# Patient Record
Sex: Female | Born: 1965 | Race: White | Hispanic: No | Marital: Married | Smoking: Never smoker
Health system: Southern US, Community
[De-identification: ages and names within clinical notes are randomized; demographics above are authoritative.]

## PROBLEM LIST (undated history)

## (undated) DIAGNOSIS — B019 Varicella without complication: Secondary | ICD-10-CM

## (undated) DIAGNOSIS — IMO0001 Reserved for inherently not codable concepts without codable children: Secondary | ICD-10-CM

## (undated) DIAGNOSIS — N390 Urinary tract infection, site not specified: Secondary | ICD-10-CM

## (undated) HISTORY — PX: WISDOM TOOTH EXTRACTION: SHX21

## (undated) HISTORY — DX: Reserved for inherently not codable concepts without codable children: IMO0001

## (undated) HISTORY — DX: Urinary tract infection, site not specified: N39.0

## (undated) HISTORY — DX: Varicella without complication: B01.9

---

## 2010-06-29 ENCOUNTER — Ambulatory Visit: Payer: Self-pay | Admitting: Unknown Physician Specialty

## 2011-07-13 ENCOUNTER — Ambulatory Visit: Payer: Self-pay | Admitting: Unknown Physician Specialty

## 2011-07-19 LAB — HM PAP SMEAR

## 2012-07-08 ENCOUNTER — Ambulatory Visit: Payer: Self-pay | Admitting: Internal Medicine

## 2012-07-16 ENCOUNTER — Ambulatory Visit: Payer: Self-pay | Admitting: Internal Medicine

## 2012-08-08 ENCOUNTER — Encounter: Payer: Self-pay | Admitting: Internal Medicine

## 2012-08-08 ENCOUNTER — Ambulatory Visit (INDEPENDENT_AMBULATORY_CARE_PROVIDER_SITE_OTHER): Payer: BC Managed Care – PPO | Admitting: Internal Medicine

## 2012-08-08 VITALS — BP 140/90 | HR 67 | Temp 98.7°F | Ht 68.25 in | Wt 142.0 lb

## 2012-08-08 DIAGNOSIS — Z0000 Encounter for general adult medical examination without abnormal findings: Secondary | ICD-10-CM

## 2012-08-08 DIAGNOSIS — Z1239 Encounter for other screening for malignant neoplasm of breast: Secondary | ICD-10-CM

## 2012-08-08 NOTE — Assessment & Plan Note (Signed)
General exam today is normal. Mammogram scheduled. Health maintenance is up to date with the exception of tetanus and pertussis Vaccine. Patient was given information on this and like to read over it before receiving vaccine. Encourage continued efforts at healthy diet and regular physical activity.

## 2012-08-08 NOTE — Progress Notes (Signed)
  Subjective:    Patient ID: Latasha Cooper, female    DOB: 18-Mar-1966, 46 y.o.   MRN: [id number]  HPI 46 year old female presents to establish care. She reports she is generally feeling well. She has no concerns today. She follows a healthy diet and exercises on a regular basis. She has a history of constipation for which she uses a psyllium supplement. She reports that bowel movements have been more regular with this medication. She denies any abdominal pain, painful bowel movements, blood in her stool. In regards to health maintenance, she is due for mammogram.  Outpatient Encounter Prescriptions as of 08/08/2012  Medication Sig Dispense Refill  . IUD's (PARAGARD INTRAUTERINE COPPER IU) 1 Device by Intrauterine route once.      . psyllium (REGULOID) 0.52 G capsule Take 0.52 g by mouth daily.        Review of Systems  Constitutional: Negative for fever, chills, appetite change, fatigue and unexpected weight change.  HENT: Negative for ear pain, congestion, sore throat, trouble swallowing, neck pain, voice change and sinus pressure.   Eyes: Negative for visual disturbance.  Respiratory: Negative for cough, shortness of breath, wheezing and stridor.   Cardiovascular: Negative for chest pain, palpitations and leg swelling.  Gastrointestinal: Negative for nausea, vomiting, abdominal pain, diarrhea, constipation, blood in stool, abdominal distention and anal bleeding.  Genitourinary: Negative for dysuria and flank pain.  Musculoskeletal: Negative for myalgias, arthralgias and gait problem.  Skin: Negative for color change and rash.  Neurological: Negative for dizziness and headaches.  Hematological: Negative for adenopathy. Does not bruise/bleed easily.  Psychiatric/Behavioral: Negative for suicidal ideas, disturbed wake/sleep cycle and dysphoric mood. The patient is not nervous/anxious.    BP 140/90  Pulse 67  Temp 98.7 F (37.1 C) (Oral)  Ht 5' 8.25" (1.734 m)  Wt 142 lb (64.411 kg)  BMI  21.43 kg/m2  SpO2 98%  LMP 07/29/2012     Objective:   Physical Exam  Constitutional: She is oriented to person, place, and time. She appears well-developed and well-nourished. No distress.  HENT:  Head: Normocephalic and atraumatic.  Right Ear: External ear normal.  Left Ear: External ear normal.  Nose: Nose normal.  Mouth/Throat: Oropharynx is clear and moist. No oropharyngeal exudate.  Eyes: Conjunctivae are normal. Pupils are equal, round, and reactive to light. Right eye exhibits no discharge. Left eye exhibits no discharge. No scleral icterus.  Neck: Normal range of motion. Neck supple. No tracheal deviation present. No thyromegaly present.  Cardiovascular: Normal rate, regular rhythm, normal heart sounds and intact distal pulses.  Exam reveals no gallop and no friction rub.   No murmur heard. Pulmonary/Chest: Effort normal and breath sounds normal. No respiratory distress. She has no wheezes. She has no rales. She exhibits no tenderness.  Abdominal: Soft. Bowel sounds are normal. She exhibits no distension and no mass. There is no tenderness. There is no guarding.  Musculoskeletal: Normal range of motion. She exhibits no edema and no tenderness.  Lymphadenopathy:    She has no cervical adenopathy.  Neurological: She is alert and oriented to person, place, and time. No cranial nerve deficit. She exhibits normal muscle tone. Coordination normal.  Skin: Skin is warm and dry. No rash noted. She is not diaphoretic. No erythema. No pallor.  Psychiatric: She has a normal mood and affect. Her behavior is normal. Judgment and thought content normal.          Assessment & Plan:

## 2012-08-08 NOTE — Assessment & Plan Note (Signed)
Will schedule mammogram today. 

## 2012-09-11 ENCOUNTER — Ambulatory Visit: Payer: Self-pay | Admitting: Internal Medicine

## 2012-09-24 ENCOUNTER — Encounter: Payer: Self-pay | Admitting: Internal Medicine

## 2012-12-26 ENCOUNTER — Encounter: Payer: Self-pay | Admitting: Adult Health

## 2012-12-26 ENCOUNTER — Ambulatory Visit (INDEPENDENT_AMBULATORY_CARE_PROVIDER_SITE_OTHER): Payer: BC Managed Care – PPO | Admitting: Adult Health

## 2012-12-26 VITALS — BP 120/80 | HR 75 | Temp 98.2°F | Resp 14 | Ht 68.0 in

## 2012-12-26 DIAGNOSIS — R319 Hematuria, unspecified: Secondary | ICD-10-CM

## 2012-12-26 DIAGNOSIS — N390 Urinary tract infection, site not specified: Secondary | ICD-10-CM

## 2012-12-26 DIAGNOSIS — R300 Dysuria: Secondary | ICD-10-CM

## 2012-12-26 LAB — POCT URINALYSIS DIPSTICK
Bilirubin, UA: NEGATIVE
Ketones, UA: NEGATIVE
Nitrite, UA: NEGATIVE
Protein, UA: NEGATIVE
pH, UA: 5.5

## 2012-12-26 MED ORDER — CIPROFLOXACIN HCL 250 MG PO TABS: 250.0000 mg | ORAL_TABLET | Freq: Two times a day (BID) | ORAL | Status: DC

## 2012-12-26 NOTE — Assessment & Plan Note (Signed)
Patient with 2 separate episodes of hematuria. Suspect this might be 2/2 UTI. However, will recheck urine in 1 month to r/o microscopic hematuria.

## 2012-12-26 NOTE — Progress Notes (Signed)
  Subjective:    Patient ID: Latasha Cooper, female    DOB: Jan 09, 1966, 47 y.o.   MRN: [id number]  HPI  Patient presents to clinic with s/s of dysuria, urgency that have been ongoing x 2 weeks. The symptoms resolved for a few days last week but have now returned. She has tried to stay hydrated and avoiding caffeine etc but without resolution. She has had hematuria that she recalls twice. Denies fever, chills.  Current Outpatient Prescriptions on File Prior to Visit  Medication Sig Dispense Refill  . IUD's (PARAGARD INTRAUTERINE COPPER IU) 1 Device by Intrauterine route once.      . psyllium (REGULOID) 0.52 G capsule Take 0.52 g by mouth daily.         Review of Systems  Constitutional: Negative for fever and chills.  Genitourinary: Positive for dysuria and hematuria. Negative for flank pain.   BP 120/80  Pulse 75  Temp 98.2 F (36.8 C) (Oral)  Ht 5\' 8"  (1.727 m)  SpO2 97%  LMP 12/05/2012     Objective:   Physical Exam  Constitutional: She is oriented to person, place, and time. She appears well-developed and well-nourished. No distress.  Cardiovascular: Normal rate, regular rhythm and normal heart sounds.   Pulmonary/Chest: Effort normal and breath sounds normal.  Abdominal: Soft. Bowel sounds are normal. She exhibits no mass. There is no tenderness.  Neurological: She is alert and oriented to person, place, and time.  Skin: Skin is warm and dry.  Psychiatric: She has a normal mood and affect. Her behavior is normal. Judgment and thought content normal.      Assessment & Plan:

## 2012-12-26 NOTE — Patient Instructions (Addendum)
  Start your antibiotic today.  Stay hydrated.  You can also take pyridium which is sold over the counter (AZO, Urostat).  Call if your symptoms are not improved within 2-3 days.  Return for follow up in approx. 1 month for urinalysis to check if the blood has cleared.

## 2012-12-26 NOTE — Assessment & Plan Note (Signed)
Suspect UTI. UA dipstick with trace blood and positive for leukocytes. Urine sent for culture. Start Cipro x 3 days. RTC if symptoms do not improve.

## 2012-12-29 LAB — URINE CULTURE

## 2013-02-01 ENCOUNTER — Other Ambulatory Visit: Payer: Self-pay

## 2013-10-23 ENCOUNTER — Other Ambulatory Visit: Payer: Self-pay

## 2014-02-27 ENCOUNTER — Encounter: Payer: Self-pay | Admitting: Internal Medicine

## 2014-02-27 ENCOUNTER — Ambulatory Visit (INDEPENDENT_AMBULATORY_CARE_PROVIDER_SITE_OTHER): Payer: BC Managed Care – PPO | Admitting: Internal Medicine

## 2014-02-27 VITALS — BP 120/90 | HR 74 | Temp 98.3°F | Ht 68.25 in | Wt 139.0 lb

## 2014-02-27 DIAGNOSIS — Z0000 Encounter for general adult medical examination without abnormal findings: Secondary | ICD-10-CM

## 2014-02-27 DIAGNOSIS — Z0001 Encounter for general adult medical examination with abnormal findings: Secondary | ICD-10-CM | POA: Insufficient documentation

## 2014-02-27 NOTE — Progress Notes (Signed)
Pre visit review using our clinic review tool, if applicable. No additional management support is needed unless otherwise documented below in the visit note. 

## 2014-02-27 NOTE — Assessment & Plan Note (Signed)
General medical exam normal today including breast exam. PAP and pelvic deferred per pt preference (currently menstruating). Mammogram ordered placed. Pt declines lab work at this time. Encouraged healthy diet and exercise. Pt declines flu vaccine.

## 2014-02-27 NOTE — Progress Notes (Signed)
Subjective:    Patient ID: Latasha Cooper, female    DOB: 06-27-1966, 49 y.o.   MRN: [id number]  HPI 48YO female presents for annual exam.  She would like to have mammogram ordered. She denies noting any new lesions in her breasts. She notes some dietary indiscretion recently. She is currently menstruating.   Outpatient Prescriptions Prior to Visit  Medication Sig Dispense Refill  . IUD's (PARAGARD INTRAUTERINE COPPER IU) 1 Device by Intrauterine route once.      . psyllium (REGULOID) 0.52 G capsule Take 0.52 g by mouth daily.       No facility-administered medications prior to visit.     Review of Systems  Constitutional: Negative for fever, chills, appetite change, fatigue and unexpected weight change.  HENT: Negative for congestion, ear pain, sinus pressure, sore throat, trouble swallowing and voice change.   Eyes: Negative for visual disturbance.  Respiratory: Negative for cough, shortness of breath, wheezing and stridor.   Cardiovascular: Negative for chest pain, palpitations and leg swelling.  Gastrointestinal: Negative for nausea, vomiting, abdominal pain, diarrhea, constipation, blood in stool, abdominal distention and anal bleeding.  Genitourinary: Negative for dysuria and flank pain.  Musculoskeletal: Negative for arthralgias, gait problem, myalgias and neck pain.  Skin: Negative for color change and rash.  Neurological: Negative for dizziness and headaches.  Hematological: Negative for adenopathy. Does not bruise/bleed easily.  Psychiatric/Behavioral: Negative for suicidal ideas, sleep disturbance and dysphoric mood. The patient is not nervous/anxious.        Objective:    BP 120/90  Pulse 74  Temp(Src) 98.3 F (36.8 C) (Oral)  Ht 5' 8.25" (1.734 m)  Wt 139 lb (63.05 kg)  BMI 20.97 kg/m2  SpO2 99%  LMP 02/27/2014 Physical Exam  Constitutional: She is oriented to person, place, and time. She appears well-developed and well-nourished. No distress.  HENT:  Head:  Normocephalic and atraumatic.  Right Ear: External ear normal.  Left Ear: External ear normal.  Nose: Nose normal.  Mouth/Throat: Oropharynx is clear and moist. No oropharyngeal exudate.  Eyes: Conjunctivae are normal. Pupils are equal, round, and reactive to light. Right eye exhibits no discharge. Left eye exhibits no discharge. No scleral icterus.  Neck: Normal range of motion. Neck supple. No tracheal deviation present. No thyromegaly present.  Cardiovascular: Normal rate, regular rhythm, normal heart sounds and intact distal pulses.  Exam reveals no gallop and no friction rub.   No murmur heard. Pulmonary/Chest: Effort normal and breath sounds normal. No accessory muscle usage. Not tachypneic. No respiratory distress. She has no decreased breath sounds. She has no wheezes. She has no rales. She exhibits no tenderness. Right breast exhibits no inverted nipple, no mass, no nipple discharge, no skin change and no tenderness. Left breast exhibits no inverted nipple, no mass, no nipple discharge, no skin change and no tenderness. Breasts are symmetrical.  Abdominal: Soft. Bowel sounds are normal. She exhibits no distension and no mass. There is no tenderness. There is no rebound and no guarding.  Musculoskeletal: Normal range of motion. She exhibits no edema and no tenderness.  Lymphadenopathy:    She has no cervical adenopathy.  Neurological: She is alert and oriented to person, place, and time. No cranial nerve deficit. She exhibits normal muscle tone. Coordination normal.  Skin: Skin is warm and dry. No rash noted. She is not diaphoretic. No erythema. No pallor.  Psychiatric: She has a normal mood and affect. Her behavior is normal. Judgment and thought content normal.  Assessment & Plan:   Problem List Items Addressed This Visit   Routine general medical examination at a health care facility - Primary     General medical exam normal today including breast exam. PAP and pelvic  deferred per pt preference (currently menstruating). Mammogram ordered placed. Pt declines lab work at this time. Encouraged healthy diet and exercise. Pt declines flu vaccine.    Relevant Orders      MM Digital Screening       Return in about 1 year (around 02/28/2015) for Physical.

## 2014-03-04 ENCOUNTER — Encounter: Payer: Self-pay | Admitting: Emergency Medicine

## 2014-03-12 ENCOUNTER — Ambulatory Visit: Payer: Self-pay | Admitting: Internal Medicine

## 2014-03-18 ENCOUNTER — Encounter: Payer: Self-pay | Admitting: Internal Medicine

## 2014-03-26 ENCOUNTER — Encounter: Payer: Self-pay | Admitting: Internal Medicine

## 2015-03-02 ENCOUNTER — Encounter: Payer: BC Managed Care – PPO | Admitting: Internal Medicine

## 2015-03-30 ENCOUNTER — Encounter: Payer: Self-pay | Admitting: Internal Medicine

## 2015-05-25 ENCOUNTER — Encounter: Payer: Self-pay | Admitting: Internal Medicine

## 2015-05-25 ENCOUNTER — Other Ambulatory Visit: Payer: Self-pay | Admitting: Internal Medicine

## 2015-05-25 ENCOUNTER — Ambulatory Visit (INDEPENDENT_AMBULATORY_CARE_PROVIDER_SITE_OTHER): Payer: BLUE CROSS/BLUE SHIELD | Admitting: Internal Medicine

## 2015-05-25 VITALS — BP 125/85 | HR 73 | Temp 98.0°F | Ht 68.0 in | Wt 138.2 lb

## 2015-05-25 DIAGNOSIS — Z1231 Encounter for screening mammogram for malignant neoplasm of breast: Secondary | ICD-10-CM

## 2015-05-25 DIAGNOSIS — Z0000 Encounter for general adult medical examination without abnormal findings: Secondary | ICD-10-CM | POA: Diagnosis not present

## 2015-05-25 NOTE — Progress Notes (Signed)
Pre visit review using our clinic review tool, if applicable. No additional management support is needed unless otherwise documented below in the visit note. 

## 2015-05-25 NOTE — Progress Notes (Signed)
   Subjective:    Patient ID: Latasha Cooper, female    DOB: 03-13-1966, 49 y.o.   MRN: [id number]  HPI  49YO female presents for annual exam.  Follows healthy diet. Exercises by playing tennis and walking. No concerns today.    Past medical, surgical, family and social history per today's encounter.  Review of Systems  Constitutional: Negative for fever, chills, appetite change, fatigue and unexpected weight change.  HENT: Positive for hearing loss (right). Negative for congestion, ear discharge, ear pain, rhinorrhea, sinus pressure, sore throat, trouble swallowing and voice change.   Eyes: Negative for visual disturbance.  Respiratory: Negative for shortness of breath.   Cardiovascular: Negative for chest pain and leg swelling.  Gastrointestinal: Negative for abdominal pain, diarrhea and constipation.  Musculoskeletal: Negative for myalgias and arthralgias.  Skin: Negative for color change and rash.  Hematological: Negative for adenopathy. Does not bruise/bleed easily.  Psychiatric/Behavioral: Negative for suicidal ideas, sleep disturbance and dysphoric mood. The patient is not nervous/anxious.        Objective:    BP 125/85 mmHg  Pulse 73  Temp(Src) 98 F (36.7 C) (Oral)  Ht 5\' 8"  (1.727 m)  Wt 138 lb 4 oz (62.71 kg)  BMI 21.03 kg/m2  SpO2 100%  LMP 05/14/2015 Physical Exam  Constitutional: She is oriented to person, place, and time. She appears well-developed and well-nourished. No distress.  HENT:  Head: Normocephalic and atraumatic.  Right Ear: External ear normal.  Left Ear: External ear normal.  Nose: Nose normal.  Mouth/Throat: Oropharynx is clear and moist. No oropharyngeal exudate.  Right ear canal initially obstructed with cerumen, removed with warm water lavage.  Eyes: Conjunctivae and EOM are normal. Pupils are equal, round, and reactive to light. Right eye exhibits no discharge.  Neck: Normal range of motion. Neck supple. No thyromegaly present.    Cardiovascular: Normal rate, regular rhythm, normal heart sounds and intact distal pulses.  Exam reveals no gallop and no friction rub.   No murmur heard. Pulmonary/Chest: Effort normal. No respiratory distress. She has no wheezes. She has no rales.  Abdominal: Soft. Bowel sounds are normal. She exhibits no distension and no mass. There is no tenderness. There is no rebound and no guarding.  Musculoskeletal: Normal range of motion. She exhibits no edema or tenderness.  Lymphadenopathy:    She has no cervical adenopathy.  Neurological: She is alert and oriented to person, place, and time. No cranial nerve deficit. Coordination normal.  Skin: Skin is warm and dry. No rash noted. She is not diaphoretic. No erythema. No pallor.  Psychiatric: She has a normal mood and affect. Her behavior is normal. Judgment and thought content normal.          Assessment & Plan:   Problem List Items Addressed This Visit      Unprioritized   Routine general medical examination at a health care facility - Primary    General medical exam normal today. Declines breast exam. Mammogram ordered. Not due for pelvic exam. Reports Paraguard due to be removed 2022. Declines lab work. Encouraged healthy diet and exercise.       Relevant Orders   MM Digital Screening       Return in about 1 year (around 05/24/2016) for Physical.

## 2015-05-25 NOTE — Patient Instructions (Signed)

## 2015-05-25 NOTE — Assessment & Plan Note (Addendum)
General medical exam normal today. Declines breast exam. Mammogram ordered. Not due for pelvic exam. Reports Paraguard due to be removed 2022. Declines lab work. Encouraged healthy diet and exercise.

## 2015-06-01 ENCOUNTER — Ambulatory Visit
Admission: RE | Admit: 2015-06-01 | Discharge: 2015-06-01 | Disposition: A | Discharge status: Home / Self Care | Payer: BLUE CROSS/BLUE SHIELD | Source: Ambulatory Visit | Attending: Internal Medicine | Admitting: Internal Medicine

## 2015-06-01 ENCOUNTER — Ambulatory Visit: Payer: BLUE CROSS/BLUE SHIELD

## 2015-06-01 DIAGNOSIS — Z1231 Encounter for screening mammogram for malignant neoplasm of breast: Secondary | ICD-10-CM | POA: Diagnosis not present

## 2016-01-27 ENCOUNTER — Encounter: Payer: Self-pay | Admitting: Family Medicine

## 2016-01-27 ENCOUNTER — Encounter: Payer: Self-pay | Admitting: Internal Medicine

## 2016-01-27 ENCOUNTER — Ambulatory Visit (INDEPENDENT_AMBULATORY_CARE_PROVIDER_SITE_OTHER): Payer: BLUE CROSS/BLUE SHIELD | Admitting: Family Medicine

## 2016-01-27 VITALS — BP 108/62 | HR 69 | Temp 97.8°F | Ht 68.0 in | Wt 142.0 lb

## 2016-01-27 DIAGNOSIS — R300 Dysuria: Secondary | ICD-10-CM

## 2016-01-27 DIAGNOSIS — N3001 Acute cystitis with hematuria: Secondary | ICD-10-CM | POA: Diagnosis not present

## 2016-01-27 LAB — POC URINALSYSI DIPSTICK (AUTOMATED)
Bilirubin, UA: NEGATIVE
Glucose, UA: NEGATIVE
Ketones, UA: NEGATIVE
NITRITE UA: NEGATIVE
PH UA: 8.5
PROTEIN UA: 30
SPEC GRAV UA: 1.015
UROBILINOGEN UA: 0.2

## 2016-01-27 MED ORDER — CEPHALEXIN 500 MG PO CAPS: 500.0000 mg | ORAL_CAPSULE | Freq: Two times a day (BID) | ORAL | Status: DC

## 2016-01-27 NOTE — Assessment & Plan Note (Signed)
New problem. History and urinalysis consistent with UTI. Sending for culture. Treating empirically with Keflex while awaiting culture.

## 2016-01-27 NOTE — Progress Notes (Signed)
Subjective:  Patient ID: Latasha Cooper, female    DOB: 01-23-1966  Age: 50 y.o. MRN: [id number]  CC: UTI  HPI:  50 year old female presents to clinic today for an acute visit with concerns that she has a UTI.  UTI  Patient reports a one-week history of dysuria, urgency, frequency.  She also reports that she's had hematuria as well.  No associated fevers or chills.  No flank pain.   No nausea/vomiting.  She has not taken any medication or tried anything for this.  No known exacerbating or relieving factors.  Social Hx   Social History   Social History  . Marital Status: Married    Spouse Name: N/A  . Number of Children: N/A  . Years of Education: N/A   Social History Main Topics  . Smoking status: Never Smoker   . Smokeless tobacco: None  . Alcohol Use: Yes  . Drug Use: No  . Sexual Activity: Not Asked   Other Topics Concern  . None   Social History Narrative   Lives in [place] with children      Work - [employer]   Diet - healthy   Exercise - regular   Review of Systems  Constitutional: Negative for fever and chills.  Gastrointestinal: Negative for nausea, vomiting and abdominal pain.  Genitourinary: Positive for dysuria, urgency, frequency and hematuria. Negative for flank pain.   Objective:  BP 108/62 mmHg  Pulse 69  Temp(Src) 97.8 F (36.6 C) (Oral)  Ht 5\' 8"  (1.727 m)  Wt 142 lb (64.411 kg)  BMI 21.60 kg/m2  SpO2 99%  BP/Weight 01/27/2016 05/25/2015 [id number]  Systolic BP [id number]  Diastolic BP 62 85 90  Wt. (Lbs) 142 138.25 139  BMI 21.6 21.03 20.97   Physical Exam  Constitutional: She is oriented to person, place, and time. She appears well-developed. No distress.  Cardiovascular: Normal rate and regular rhythm.   Pulmonary/Chest: Effort normal and breath sounds normal.  Abdominal: Soft. She exhibits no distension. There is no tenderness. There is no rebound and no guarding.  Neurological: She is alert and oriented to person,  place, and time.  Psychiatric: She has a normal mood and affect.  Vitals reviewed.  Results for orders placed or performed in visit on 01/27/16 (from the past 24 hour(s))  POCT Urinalysis Dipstick (Automated)     Status: Abnormal   Collection Time: 01/27/16 11:08 AM  Result Value Ref Range   Color, UA pale yellow    Clarity, UA cloudy    Glucose, UA neg    Bilirubin, UA neg    Ketones, UA neg    Spec Grav, UA 1.015    Blood, UA large    pH, UA 8.5    Protein, UA 30    Urobilinogen, UA 0.2    Nitrite, UA neg    Leukocytes, UA large (3+) (A) Negative   Assessment & Plan:   Problem List Items Addressed This Visit    Acute cystitis with hematuria - Primary    New problem. History and urinalysis consistent with UTI. Sending for culture. Treating empirically with Keflex while awaiting culture.      Relevant Medications   cephALEXin (KEFLEX) 500 MG capsule   Other Relevant Orders   POCT Urinalysis Dipstick (Automated) (Completed)   Urine culture      Meds ordered this encounter  Medications  . cephALEXin (KEFLEX) 500 MG capsule    Sig: Take 1 capsule (500 mg total) by mouth  2 (two) times daily.    Dispense:  10 capsule    Refill:  0   Follow-up: PRN  [place]

## 2016-01-27 NOTE — Progress Notes (Signed)
Pre visit review using our clinic review tool, if applicable. No additional management support is needed unless otherwise documented below in the visit note. 

## 2016-01-27 NOTE — Patient Instructions (Signed)
Take the antibiotic as prescribed.  Follow up if you worsen or fail to improve.  Take care  Dr. Lacinda Axon

## 2016-01-28 LAB — URINE CULTURE
COLONY COUNT: NO GROWTH
ORGANISM ID, BACTERIA: NO GROWTH

## 2016-06-12 ENCOUNTER — Other Ambulatory Visit: Payer: Self-pay | Admitting: Internal Medicine

## 2016-06-12 DIAGNOSIS — Z1231 Encounter for screening mammogram for malignant neoplasm of breast: Secondary | ICD-10-CM

## 2016-06-27 ENCOUNTER — Ambulatory Visit: Payer: BLUE CROSS/BLUE SHIELD

## 2016-07-11 ENCOUNTER — Ambulatory Visit: Payer: BLUE CROSS/BLUE SHIELD

## 2016-08-01 ENCOUNTER — Ambulatory Visit
Admission: RE | Admit: 2016-08-01 | Discharge: 2016-08-01 | Disposition: A | Discharge status: Home / Self Care | Payer: BLUE CROSS/BLUE SHIELD | Source: Ambulatory Visit | Attending: Internal Medicine | Admitting: Internal Medicine

## 2016-08-01 ENCOUNTER — Other Ambulatory Visit: Payer: Self-pay | Admitting: Internal Medicine

## 2016-08-01 DIAGNOSIS — Z1231 Encounter for screening mammogram for malignant neoplasm of breast: Secondary | ICD-10-CM

## 2016-11-28 ENCOUNTER — Ambulatory Visit: Payer: BLUE CROSS/BLUE SHIELD | Admitting: Family Medicine

## 2016-12-22 ENCOUNTER — Ambulatory Visit: Payer: BLUE CROSS/BLUE SHIELD | Admitting: Family Medicine

## 2017-02-05 IMAGING — MG MM DIGITAL SCREENING BILAT W/ CAD
4 series · 4 of 4 positions shown · non-contrast
Comparison: Previous exam(s).

CLINICAL DATA: Screening.

EXAM:
DIGITAL SCREENING BILATERAL MAMMOGRAM WITH CAD

[R CC]
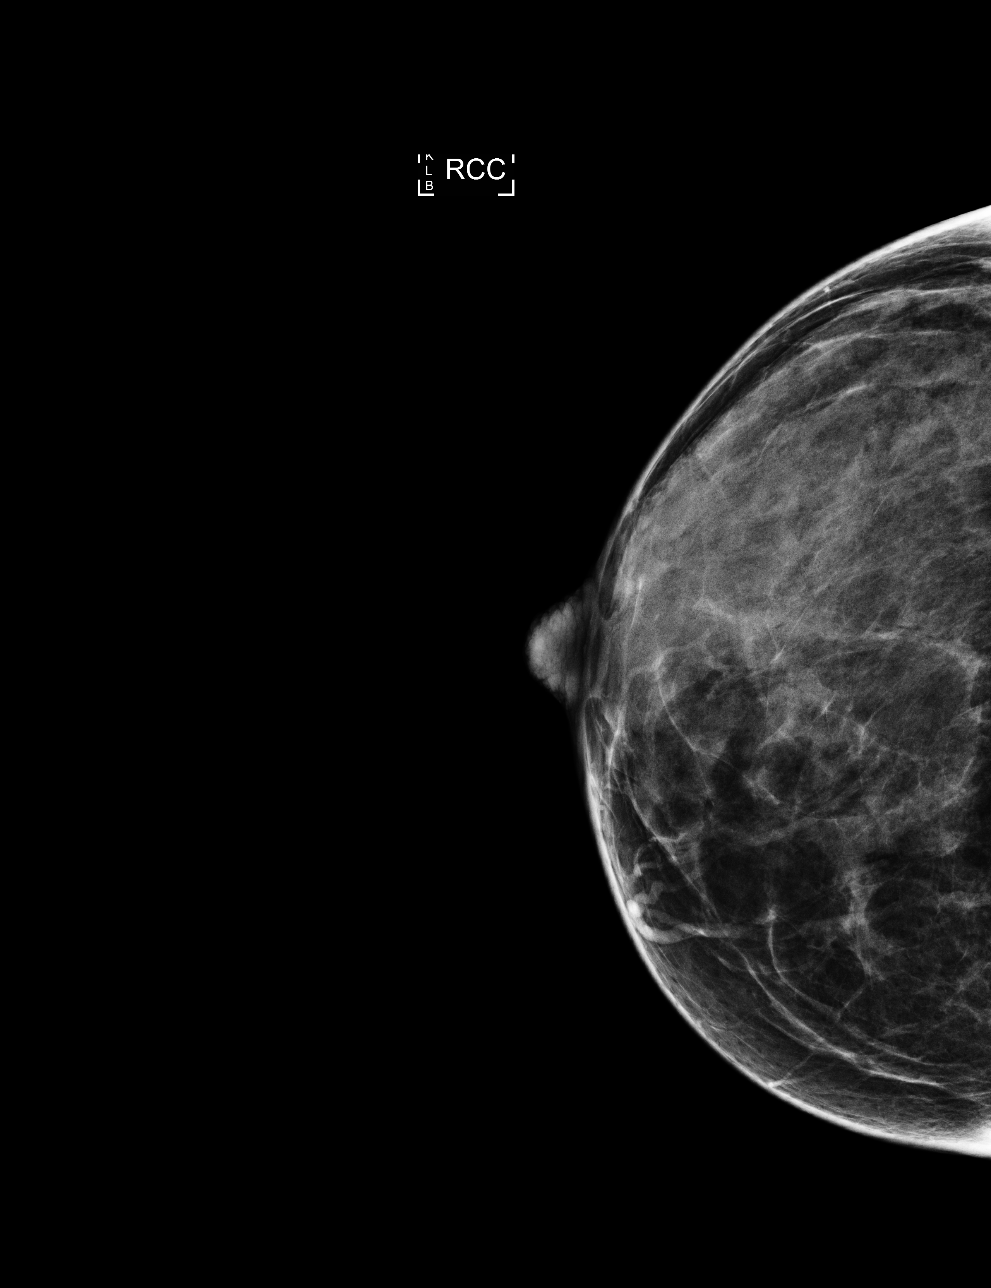

[L MLO]
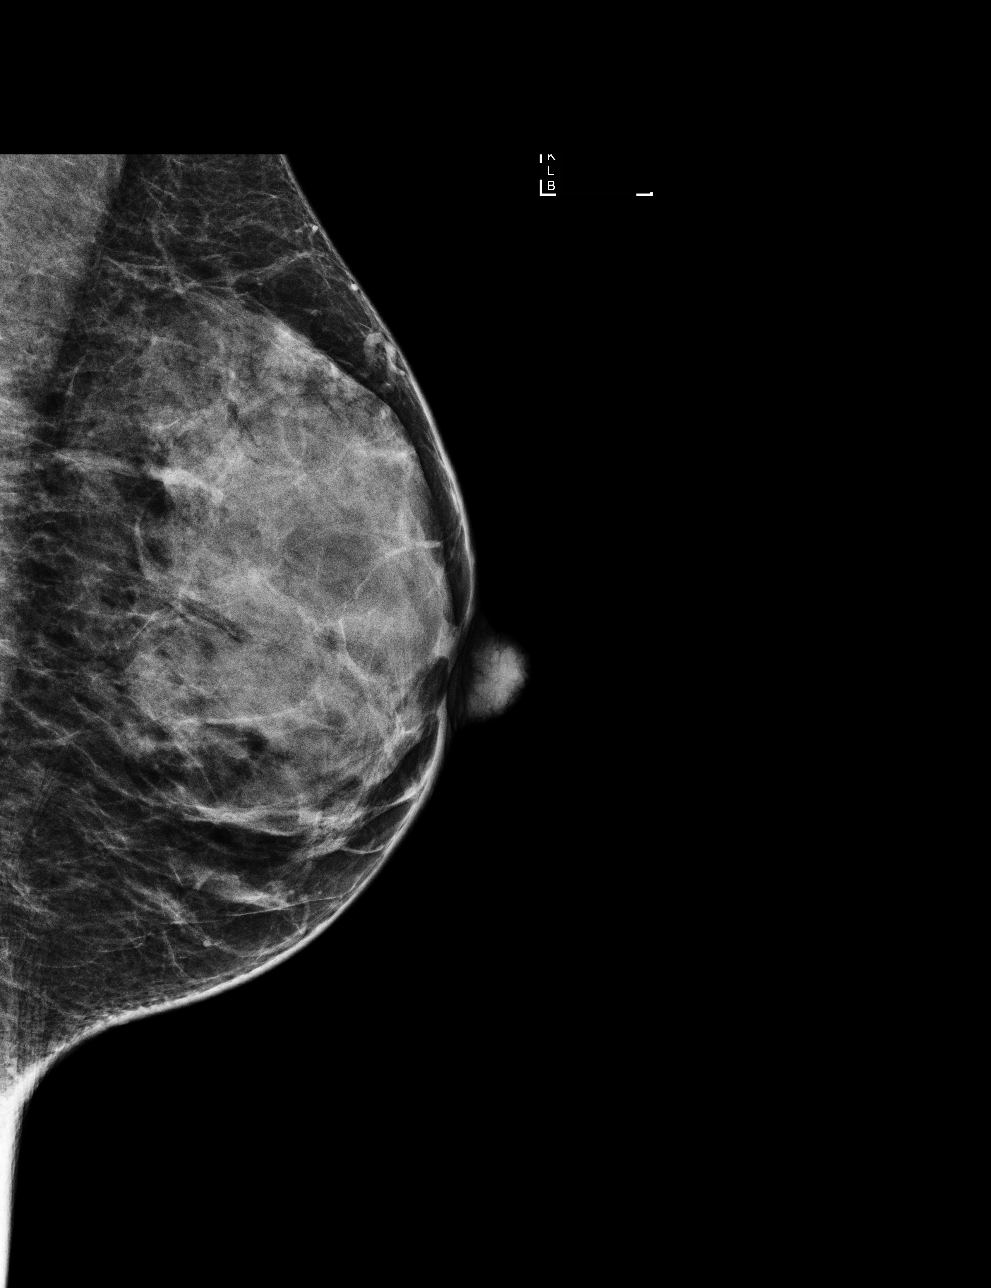

[R MLO]
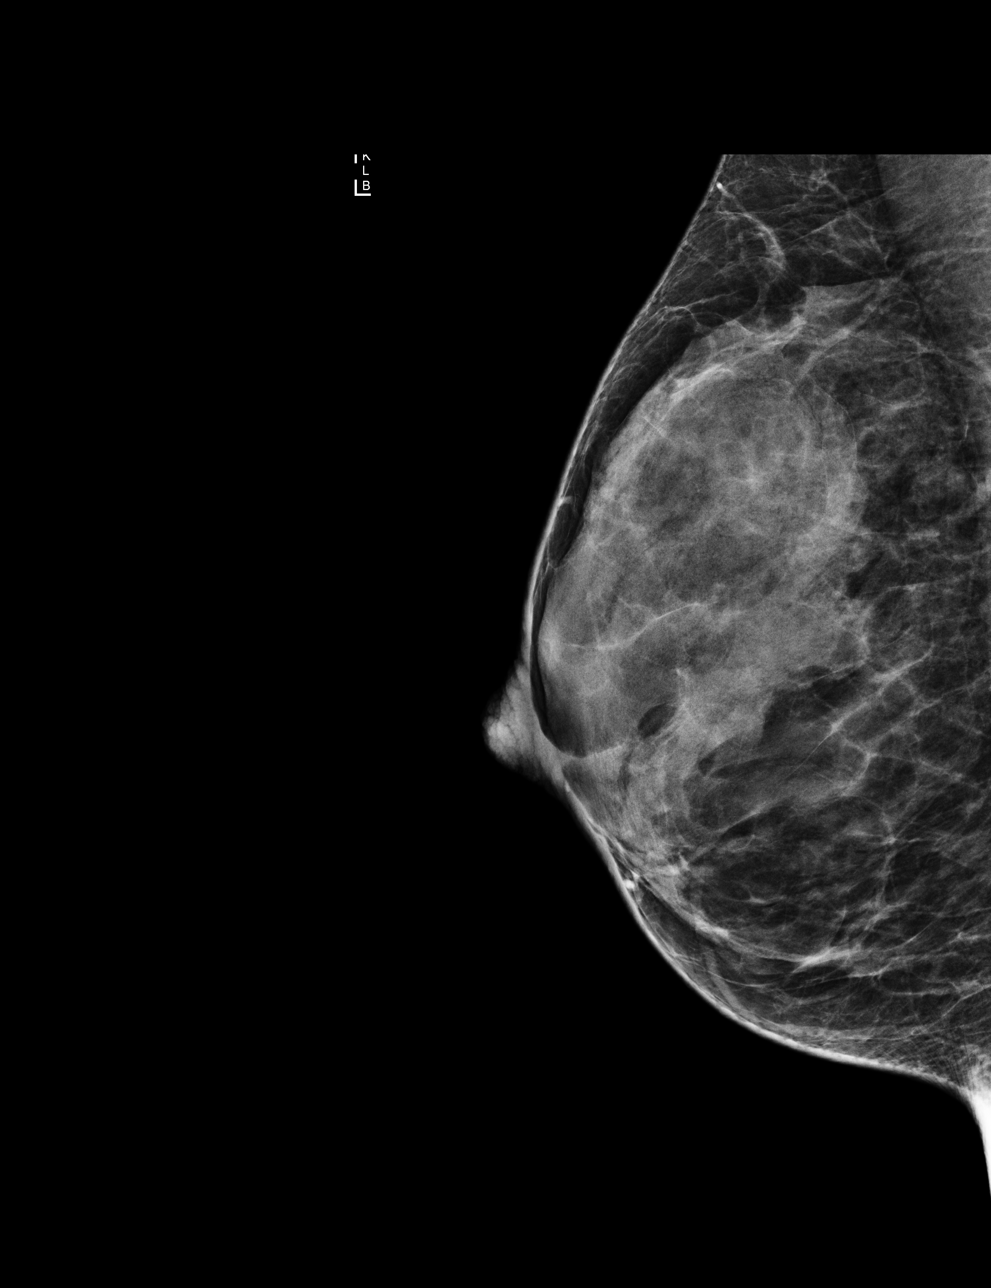

[L CC]
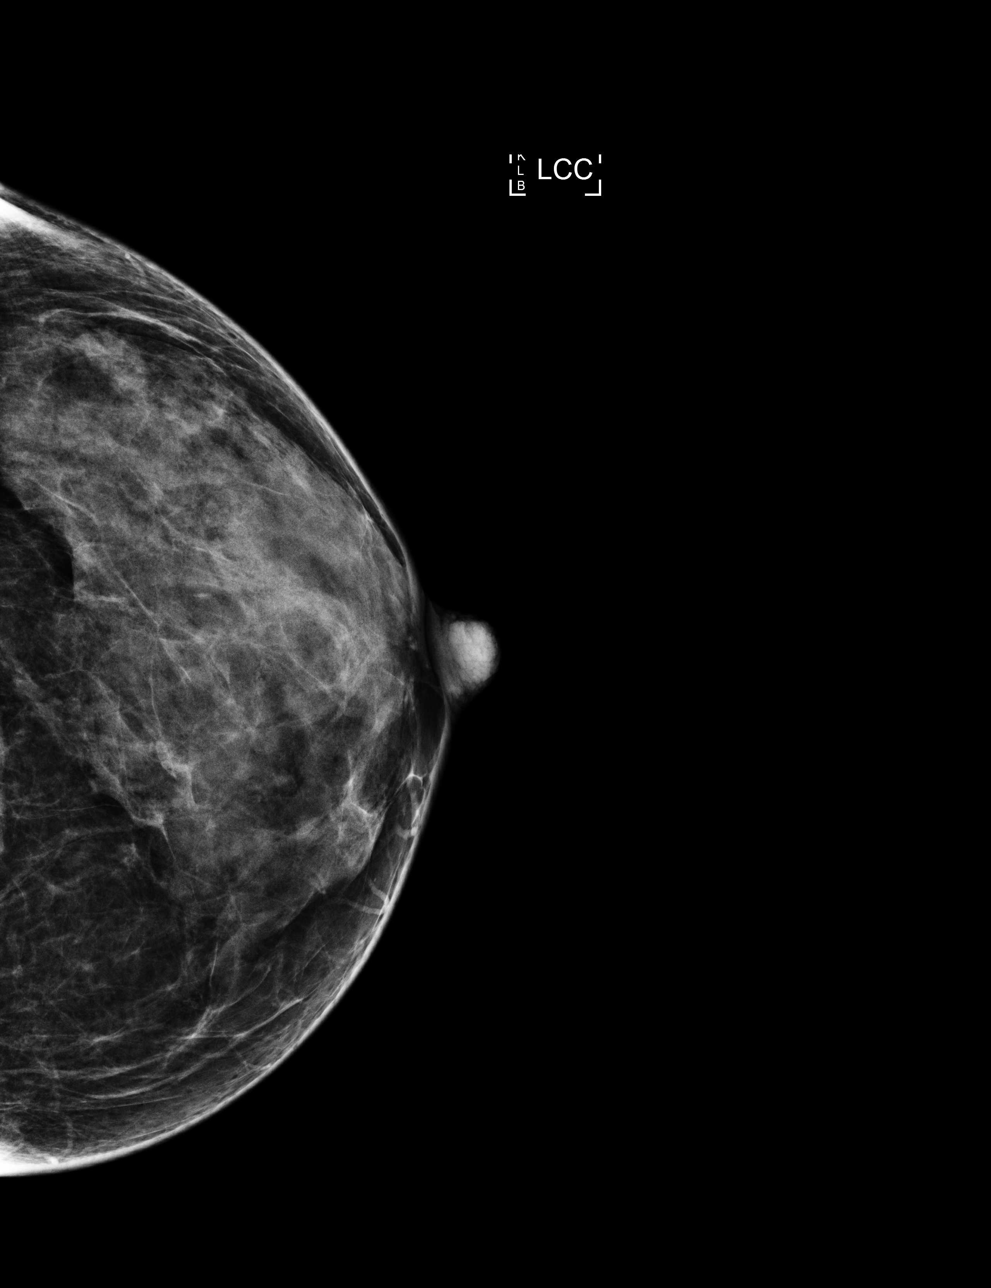

[4 of 4 positions shown; findings below may reference images not displayed]

ACR Breast Density Category d: The breast tissue is extremely dense,
which lowers the sensitivity of mammography.
FINDINGS: There are no findings suspicious for malignancy. Images were
processed with CAD.
IMPRESSION: No mammographic evidence of malignancy. A result letter of this
screening mammogram will be mailed directly to the patient.

RECOMMENDATION:
Screening mammogram in one year. (Code:[id number]-[id number])

BI-RADS CATEGORY  1: Negative.

## 2017-03-20 ENCOUNTER — Ambulatory Visit (INDEPENDENT_AMBULATORY_CARE_PROVIDER_SITE_OTHER): Payer: BLUE CROSS/BLUE SHIELD | Admitting: Primary Care

## 2017-03-20 ENCOUNTER — Other Ambulatory Visit (HOSPITAL_COMMUNITY)
Admission: RE | Admit: 2017-03-20 | Discharge: 2017-03-20 | Disposition: A | Discharge status: Home / Self Care | Payer: BLUE CROSS/BLUE SHIELD | Source: Ambulatory Visit | Attending: Family Medicine | Admitting: Family Medicine

## 2017-03-20 ENCOUNTER — Encounter: Payer: Self-pay | Admitting: Primary Care

## 2017-03-20 VITALS — BP 116/74 | HR 80 | Temp 98.2°F | Ht 68.25 in | Wt 140.1 lb

## 2017-03-20 DIAGNOSIS — Z1151 Encounter for screening for human papillomavirus (HPV): Secondary | ICD-10-CM | POA: Insufficient documentation

## 2017-03-20 DIAGNOSIS — Z0000 Encounter for general adult medical examination without abnormal findings: Secondary | ICD-10-CM | POA: Diagnosis not present

## 2017-03-20 DIAGNOSIS — Z01419 Encounter for gynecological examination (general) (routine) without abnormal findings: Secondary | ICD-10-CM | POA: Diagnosis not present

## 2017-03-20 DIAGNOSIS — Z124 Encounter for screening for malignant neoplasm of cervix: Secondary | ICD-10-CM

## 2017-03-20 NOTE — Addendum Note (Signed)
Addended by: Jacqualin Combes on: 03/20/2017 10:31 AM   Modules accepted: Orders

## 2017-03-20 NOTE — Assessment & Plan Note (Signed)
Declines Tetanus, never completed influenza vaccination. Pap due and is pending today. Mammogram due in 2019 as she prefers screening every 2 years. Discussed to continue healthy lifestyle through diet and exercise. Exam unremarkable. Labs pending. Follow up in 1 year for annual exam.

## 2017-03-20 NOTE — Patient Instructions (Signed)
Schedule a lab only appointment to return fasting for your labs. I will notify you of your results once received.   We will notify you of your pap result once received.  Continue your efforts towards a healthy lifestyle through diet and exercise.  We will repeat your mammogram in 2019.  Follow up in 1 year for your annual exam or sooner if needed.  It was a pleasure to meet you today! Please don't hesitate to call me with any questions. Welcome to [facility]!

## 2017-03-20 NOTE — Progress Notes (Signed)
Subjective:    Patient ID: Latasha Cooper, female    DOB: 1966/11/09, 51 y.o.   MRN: [id number]  HPI  [name] is a 51 year old female who presents to transfer care from [facility] and for complete physical. Will review records.   1) Menorrhagia: Currently has Paragard Copper IUD. Her last paragard insertion was placed in 2013, due for removal in 2022. Does experience moderate to heavy vaginal bleeding every three weeks during her cycle, this is not bothersome.    Immunizations: -Tetanus: Unsure, declines. -Influenza: Never completed the influenza vaccination in the past.    Diet: She endorses a healthy diet. Breakfast: Cereal, coffee Lunch: Sandwich, fruit Dinner: Meat, vegetable, starch Snacks: Nuts, chips, chocolate  Desserts: Daily Beverages: Coffee, water, beer/wine  Exercise: She exercises at the gym once to twice weekly Eye exam: Completed several years ago Dental exam: Completes semi-annually Pap Smear: Completed in 2012, due today Mammogram: Completed in 07/2016, due in 2019   Review of Systems  Constitutional: Negative for unexpected weight change.  HENT: Negative for rhinorrhea.   Respiratory: Negative for cough and shortness of breath.   Cardiovascular: Negative for chest pain.  Gastrointestinal: Negative for constipation and diarrhea.  Genitourinary: Negative for difficulty urinating and menstrual problem.  Musculoskeletal: Negative for arthralgias and myalgias.  Skin: Negative for rash.  Allergic/Immunologic: Negative for environmental allergies.  Neurological: Negative for dizziness, numbness and headaches.  Psychiatric/Behavioral:       Denies concerns for anxiety or depression       Past Medical History:  Diagnosis Date  . Chicken pox   . IUD 10/162012   Dr. Rayford Halsted  . UTI (lower urinary tract infection)      Social History   Social History  . Marital status: Married    Spouse name: N/A  . Number of children: N/A  . Years of  education: N/A   Occupational History  . Not on file.   Social History Main Topics  . Smoking status: Never Smoker  . Smokeless tobacco: Never Used  . Alcohol use Yes  . Drug use: No  . Sexual activity: Not on file   Other Topics Concern  . Not on file   Social History Narrative   Married.   2 children.    Work - [employer]   Enjoys playing tennis, reading, walking, cooking.     Past Surgical History:  Procedure Laterality Date  . VAGINAL DELIVERY     2    Family History  Problem Relation Age of Onset  . Pancreatic cancer Mother 46  . Hypertension Father   . Colon cancer Maternal Aunt     No Known Allergies  Current Outpatient Prescriptions on File Prior to Visit  Medication Sig Dispense Refill  . IUD's (PARAGARD INTRAUTERINE COPPER IU) 1 Device by Intrauterine route once.    . psyllium (REGULOID) 0.52 G capsule Take 0.52 g by mouth daily.     No current facility-administered medications on file prior to visit.     BP 116/74   Pulse 80   Temp 98.2 F (36.8 C) (Oral)   Ht 5' 8.25" (1.734 m)   Wt 140 lb 1.9 oz (63.6 kg)   LMP 03/06/2017   SpO2 98%   BMI 21.15 kg/m    Objective:   Physical Exam  Constitutional: She is oriented to person, place, and time. She appears well-nourished.  HENT:  Right Ear: Tympanic membrane and ear canal normal.  Left Ear: Tympanic  membrane and ear canal normal.  Nose: Nose normal.  Mouth/Throat: Oropharynx is clear and moist.  Eyes: Conjunctivae and EOM are normal. Pupils are equal, round, and reactive to light.  Neck: Neck supple. No thyromegaly present.  Cardiovascular: Normal rate and regular rhythm.   No murmur heard. Pulmonary/Chest: Effort normal and breath sounds normal. She has no rales.  Abdominal: Soft. Bowel sounds are normal. There is no tenderness.  Genitourinary: There is no tenderness or lesion on the right labia. There is no tenderness or lesion on the left labia. Cervix exhibits discharge. Cervix  exhibits no motion tenderness. Right adnexum displays no tenderness. Left adnexum displays no tenderness. No vaginal discharge found.  Genitourinary Comments: Mild whitish/yellow discharge, no foul smell  Musculoskeletal: Normal range of motion.  Lymphadenopathy:    She has no cervical adenopathy.  Neurological: She is alert and oriented to person, place, and time. She has normal reflexes. No cranial nerve deficit.  Skin: Skin is warm and dry. No rash noted.  Psychiatric: She has a normal mood and affect.          Assessment & Plan:

## 2017-03-20 NOTE — Progress Notes (Signed)
Pre visit review using our clinic review tool, if applicable. No additional management support is needed unless otherwise documented below in the visit note. 

## 2017-03-21 ENCOUNTER — Other Ambulatory Visit (INDEPENDENT_AMBULATORY_CARE_PROVIDER_SITE_OTHER): Payer: BLUE CROSS/BLUE SHIELD

## 2017-03-21 DIAGNOSIS — Z0000 Encounter for general adult medical examination without abnormal findings: Secondary | ICD-10-CM

## 2017-03-22 ENCOUNTER — Encounter: Payer: Self-pay | Admitting: Primary Care

## 2017-03-22 DIAGNOSIS — B373 Candidiasis of vulva and vagina: Secondary | ICD-10-CM

## 2017-03-22 DIAGNOSIS — B3731 Acute candidiasis of vulva and vagina: Secondary | ICD-10-CM

## 2017-03-22 LAB — CYTOLOGY - PAP
Diagnosis: NEGATIVE
HPV (WINDOPATH): NOT DETECTED

## 2017-03-22 LAB — COMPREHENSIVE METABOLIC PANEL
ALBUMIN: 4.3 g/dL (ref 3.5–5.2)
ALT: 15 U/L (ref 0–35)
AST: 21 U/L (ref 0–37)
Alkaline Phosphatase: 37 U/L — ABNORMAL LOW (ref 39–117)
BUN: 12 mg/dL (ref 6–23)
CHLORIDE: 103 meq/L (ref 96–112)
CO2: 29 meq/L (ref 19–32)
CREATININE: 0.82 mg/dL (ref 0.40–1.20)
Calcium: 9.2 mg/dL (ref 8.4–10.5)
GFR: 78.81 mL/min (ref >60.00)
GLUCOSE: 76 mg/dL (ref 70–99)
Potassium: 4.4 mEq/L (ref 3.5–5.1)
SODIUM: 137 meq/L (ref 135–145)
Total Bilirubin: 0.4 mg/dL (ref 0.2–1.2)
Total Protein: 7 g/dL (ref 6.0–8.3)

## 2017-03-22 LAB — LIPID PANEL
CHOLESTEROL: 216 mg/dL — AB (ref 0–200)
HDL: 68.6 mg/dL (ref >39.00)
LDL CALC: 135 mg/dL — AB (ref 0–99)
NonHDL: 147.42
TRIGLYCERIDES: 60 mg/dL (ref 0.0–149.0)
Total CHOL/HDL Ratio: 3
VLDL: 12 mg/dL (ref 0.0–40.0)

## 2017-03-23 ENCOUNTER — Ambulatory Visit: Payer: BLUE CROSS/BLUE SHIELD | Admitting: Family Medicine

## 2017-03-23 MED ORDER — FLUCONAZOLE 150 MG PO TABS: 150.0000 mg | ORAL_TABLET | Freq: Once | ORAL | 0 refills | Status: AC

## 2018-06-19 ENCOUNTER — Encounter: Payer: Self-pay | Admitting: Family Medicine

## 2018-06-19 ENCOUNTER — Ambulatory Visit: Payer: BLUE CROSS/BLUE SHIELD | Admitting: Family Medicine

## 2018-06-19 VITALS — BP 116/74 | HR 81 | Temp 98.2°F | Ht 68.0 in | Wt 133.5 lb

## 2018-06-19 DIAGNOSIS — N3090 Cystitis, unspecified without hematuria: Secondary | ICD-10-CM

## 2018-06-19 DIAGNOSIS — R350 Frequency of micturition: Secondary | ICD-10-CM

## 2018-06-19 DIAGNOSIS — H60331 Swimmer's ear, right ear: Secondary | ICD-10-CM

## 2018-06-19 LAB — POC URINALSYSI DIPSTICK (AUTOMATED)
BILIRUBIN UA: NEGATIVE
GLUCOSE UA: NEGATIVE
NITRITE UA: POSITIVE
PH UA: 6 (ref 5.0–8.0)
Protein, UA: POSITIVE — AB
Spec Grav, UA: >=1.03 — AB (ref 1.010–1.025)
Urobilinogen, UA: 0.2 E.U./dL

## 2018-06-19 MED ORDER — NITROFURANTOIN MONOHYD MACRO 100 MG PO CAPS: 100.0000 mg | ORAL_CAPSULE | Freq: Two times a day (BID) | ORAL | 0 refills | Status: DC

## 2018-06-19 MED ORDER — NEOMYCIN-POLYMYXIN-HC 3.5-10000-1 OT SOLN: 4.0000 [drp] | Freq: Three times a day (TID) | OTIC | 0 refills | Status: DC

## 2018-06-19 NOTE — Progress Notes (Signed)
Subjective:    Patient ID: Latasha Cooper, female    DOB: [birth year], 52 y.o.   MRN: [id number]  HPI This is a 52 yo female who presents today with right ear pain and fullness x 3-4 days. Feels like fluid in ear, can't hear. Uncomfortable, not painful. She did swim about 10 days ago, put head under water. Has history of swimmer's ear, small ear canals.  She also mentions urinary frequency with burning x 10 days. Last UTI several years ago, usually symptoms resolve on their own. Probably has not had enough fluids lately, has been playing tennis outdoors and it is very hot. No fever/chills/nausea/vomiting/abdominal pain/ flank pain.   Past Medical History:  Diagnosis Date  . Chicken pox   . IUD 10/162012   Dr. Rayford Halsted  . UTI (lower urinary tract infection)    Past Surgical History:  Procedure Laterality Date  . VAGINAL DELIVERY     2   Family History  Problem Relation Age of Onset  . Pancreatic cancer Mother 10  . Hypertension Father   . Colon cancer Maternal Aunt    Social History   Tobacco Use  . Smoking status: Never Smoker  . Smokeless tobacco: Never Used  Substance Use Topics  . Alcohol use: Yes  . Drug use: No      Review of Systems Per HPI    Objective:   Physical Exam  Constitutional: She is oriented to person, place, and time. She appears well-developed and well-nourished. No distress.  HENT:  Head: Normocephalic and atraumatic.  Right Ear: Tympanic membrane and external ear normal. There is swelling and tenderness.  Left Ear: Tympanic membrane, external ear and ear canal normal.  Right ear canal swollen, able to visualize TM when pulling pinna up and back. Tender to exam.   Eyes: Conjunctivae are normal.  Cardiovascular: Normal rate, regular rhythm and normal heart sounds.  Pulmonary/Chest: Effort normal and breath sounds normal.  Abdominal: Soft. Bowel sounds are normal. She exhibits no distension. There is no tenderness. There is no guarding and no CVA  tenderness.  Neurological: She is alert and oriented to person, place, and time.  Skin: Skin is warm and dry. She is not diaphoretic.  Psychiatric: She has a normal mood and affect. Her behavior is normal. Judgment and thought content normal.  Vitals reviewed.     BP 116/74   Pulse 81   Temp 98.2 F (36.8 C) (Oral)   Ht 5\' 8"  (1.727 m)   Wt 133 lb 8 oz (60.6 kg)   SpO2 98%   BMI 20.30 kg/m  Wt Readings from Last 3 Encounters:  06/19/18 133 lb 8 oz (60.6 kg)  03/20/17 140 lb 1.9 oz (63.6 kg)  01/27/16 142 lb (64.4 kg)   Results for orders placed or performed in visit on 06/19/18  POCT Urinalysis Dipstick (Automated)  Result Value Ref Range   Color, UA yellow    Clarity, UA cloudy    Glucose, UA Negative Negative   Bilirubin, UA Negative    Ketones, UA +-    Spec Grav, UA >=1.030 (A) 1.010 - 1.025   Blood, UA 2+    pH, UA 6.0 5.0 - 8.0   Protein, UA Positive (A) Negative   Urobilinogen, UA 0.2 0.2 or 1.0 E.U./dL   Nitrite, UA Positive    Leukocytes, UA Moderate (2+) (A) Negative       Assessment & Plan:  1. Urinary frequency - POCT Urinalysis Dipstick (Automated)  2. Cystitis -  Provided written and verbal information regarding diagnosis and treatment. - RTC/ER precautions reviewed - Urine Culture - nitrofurantoin, macrocrystal-monohydrate, (MACROBID) 100 MG capsule; Take 1 capsule (100 mg total) by mouth 2 (two) times daily.  Dispense: 14 capsule; Refill: 0  3. Acute swimmer's ear of right side - Provided written and verbal information regarding diagnosis and treatment. - RTC/ER precautions reviewed - neomycin-polymyxin-hydrocortisone (CORTISPORIN) OTIC solution; Place 4 drops into the right ear 3 (three) times daily. For 7-10 days.  Dispense: 10 mL; Refill: 0   Clarene Reamer, FNP-BC  [facility]  06/19/2018 4:32 PM

## 2018-06-19 NOTE — Patient Instructions (Signed)
Good to see you today  After swimming, instill small amount of solution of equal parts white vinegar and rubbing alcohol to dry ear canal   Otitis Externa Otitis externa is an infection of the outer ear canal. The outer ear canal is the area between the outside of the ear and the eardrum. Otitis externa is sometimes called "swimmer's ear." What are the causes? This condition may be caused by:  Swimming in dirty water.  Moisture in the ear.  An injury to the inside of the ear.  An object stuck in the ear.  A cut or scrape on the outside of the ear.  What increases the risk? This condition is more likely to develop in swimmers. What are the signs or symptoms? The first symptom of this condition is often itching in the ear. Later signs and symptoms include:  Swelling of the ear.  Redness in the ear.  Ear pain. The pain may get worse when you pull on your ear.  Pus coming from the ear.  How is this diagnosed? This condition may be diagnosed by examining the ear and testing fluid from the ear for bacteria and funguses. How is this treated? This condition may be treated with:  Antibiotic ear drops. These are often given for 10-14 days.  Medicine to reduce itching and swelling.  Follow these instructions at home:  If you were prescribed antibiotic ear drops, apply them as told by your health care provider. Do not stop using the antibiotic even if your condition improves.  Take over-the-counter and prescription medicines only as told by your health care provider.  Keep all follow-up visits as told by your health care provider. This is important. How is this prevented?  Keep your ear dry. Use the corner of a towel to dry your ear after you swim or bathe.  Avoid scratching or putting things in your ear. Doing these things can damage the ear canal or remove the protective wax that lines it, which makes it easier for bacteria and funguses to grow.  Avoid swimming in lakes,  polluted water, or pools that may not have the right amount of chlorine.  Consider making ear drops and putting 3 or 4 drops in each ear after you swim. Ask your health care provider about how you can make ear drops. Contact a health care provider if:  You have a fever.  After 3 days your ear is still red, swollen, painful, or draining pus.  Your redness, swelling, or pain gets worse.  You have a severe headache.  You have redness, swelling, pain, or tenderness in the area behind your ear. This information is not intended to replace advice given to you by your health care provider. Make sure you discuss any questions you have with your health care provider. Document Released: 12/04/2005 Document Revised: 01/11/2016 Document Reviewed: 09/13/2015 Elsevier Interactive Patient Education  2018 [employer].  Urinary Tract Infection, Adult A urinary tract infection (UTI) is an infection of any part of the urinary tract. The urinary tract includes the:  Kidneys.  Ureters.  Bladder.  Urethra.  These organs make, store, and get rid of pee (urine) in the body. Follow these instructions at home:  Take over-the-counter and prescription medicines only as told by your doctor.  If you were prescribed an antibiotic medicine, take it as told by your doctor. Do not stop taking the antibiotic even if you start to feel better.  Avoid the following drinks: ? Alcohol. ? Caffeine. ? Tea. ?  Carbonated drinks.  Drink enough fluid to keep your pee clear or pale yellow.  Keep all follow-up visits as told by your doctor. This is important.  Make sure to: ? Empty your bladder often and completely. Do not to hold pee for long periods of time. ? Empty your bladder before and after sex. ? Wipe from front to back after a bowel movement if you are female. Use each tissue one time when you wipe. Contact a doctor if:  You have back pain.  You have a fever.  You feel sick to your stomach  (nauseous).  You throw up (vomit).  Your symptoms do not get better after 3 days.  Your symptoms go away and then come back. Get help right away if:  You have very bad back pain.  You have very bad lower belly (abdominal) pain.  You are throwing up and cannot keep down any medicines or water. This information is not intended to replace advice given to you by your health care provider. Make sure you discuss any questions you have with your health care provider. Document Released: 05/22/2008 Document Revised: 05/11/2016 Document Reviewed: 10/25/2015 Elsevier Interactive Patient Education  [employer].

## 2018-06-21 LAB — URINE CULTURE
MICRO NUMBER: 90793432
SPECIMEN QUALITY:: ADEQUATE

## 2018-07-11 ENCOUNTER — Other Ambulatory Visit: Payer: Self-pay | Admitting: Primary Care

## 2018-07-11 DIAGNOSIS — Z1231 Encounter for screening mammogram for malignant neoplasm of breast: Secondary | ICD-10-CM

## 2018-08-02 ENCOUNTER — Ambulatory Visit
Admission: RE | Admit: 2018-08-02 | Discharge: 2018-08-02 | Disposition: A | Discharge status: Home / Self Care | Payer: BLUE CROSS/BLUE SHIELD | Source: Ambulatory Visit | Attending: Primary Care | Admitting: Primary Care

## 2018-08-02 DIAGNOSIS — Z1231 Encounter for screening mammogram for malignant neoplasm of breast: Secondary | ICD-10-CM | POA: Insufficient documentation

## 2018-08-20 ENCOUNTER — Ambulatory Visit: Payer: Self-pay

## 2018-08-20 NOTE — Telephone Encounter (Signed)
Pt has appt with Gentry Fitz NP on 08/28/18 at 3:40.

## 2018-08-20 NOTE — Telephone Encounter (Signed)
I'm more than happy to see her but it may be best if evaluated by her gynecologist. Depending on how long the IUD has been in place, it may need to be removed. If she'd like to start with me then I'll see her as scheduled.

## 2018-08-20 NOTE — Telephone Encounter (Signed)
Pt. Reports periods are more and more frequent. This period has lasted 3 weeks. States she does have an IUD in place. No cramping or heavy bleeding noted. Appointment made with her provider. Reason for Disposition . [1] Bleeding or spotting between regular periods AND [2] occurs more than three cycles (3 months) AND [3] using birth control medicine (pills, patch, Depo-Provera, Implanon, vaginal ring, Mirena IUD)  Answer Assessment - Initial Assessment Questions 1. AMOUNT: "Describe the bleeding that you are having."    - SPOTTING: spotting, or pinkish / brownish mucous discharge; does not fill panti-liner or pad    - MILD:  less than 1 pad / hour; less than patient's usual menstrual bleeding   - MODERATE: 1-2 pads / hour; 1 menstrual cup every 6 hours; small-medium blood clots (e.g., pea, grape, small coin)   - SEVERE: soaking 2 or more pads/hour for 2 or more hours; 1 menstrual cup every 2 hours; bleeding not contained by pads or continuous red blood from vagina; large blood clots (e.g., golf ball, large coin)      Mild 2. ONSET: "When did the bleeding begin?" "Is it continuing now?"     3 weeks ago 3. MENSTRUAL PERIOD: "When was the last normal menstrual period?" "How is this different than your period?"      Lasting 3 weeks 4. REGULARITY: "How regular are your periods?"     More frequent 5. ABDOMINAL PAIN: "Do you have any pain?" "How bad is the pain?"  (e.g., Scale 1-10; mild, moderate, or severe)   - MILD (1-3): doesn't interfere with normal activities, abdomen soft and not tender to touch    - MODERATE (4-7): interferes with normal activities or awakens from sleep, tender to touch    - SEVERE (8-10): excruciating pain, doubled over, unable to do any normal activities      No 6. PREGNANCY: "Could you be pregnant?" "Are you sexually active?" "Did you recently give birth?"     No 7. BREASTFEEDING: "Are you breastfeeding?"     No 8. HORMONES: "Are you taking any hormone medications,  prescription or OTC?" (e.g., birth control pills, estrogen)     No 9. BLOOD THINNERS: "Do you take any blood thinners?" (e.g., Coumadin/warfarin, Pradaxa/dabigatran, aspirin)     No 10. CAUSE: "What do you think is causing the bleeding?" (e.g., recent gyn surgery, recent gyn procedure; known bleeding disorder, cervical cancer, polycystic ovarian disease, fibroids)         Premenopause 11. HEMODYNAMIC STATUS: "Are you weak or feeling lightheaded?" If so, ask: "Can you stand and walk normally?"        No weakness 12. OTHER SYMPTOMS: "What other symptoms are you having with the bleeding?" (e.g., passed tissue, vaginal discharge, fever, menstrual-type cramps)       No  Protocols used: VAGINAL BLEEDING - ABNORMAL-A-AH

## 2018-08-26 NOTE — Telephone Encounter (Signed)
Message left for patient to return my call.  

## 2018-08-27 NOTE — Telephone Encounter (Addendum)
Pt called back and notified as instructed; pt voiced understanding and said since she has appt with Allie Bossier NP on 08/28/18 will start with seeing Anda Kraft and then see GYN if necessary. Pt thinks has had IUD maybe 6 years. FYI to Kendrick.

## 2018-08-28 ENCOUNTER — Encounter: Payer: Self-pay | Admitting: Primary Care

## 2018-08-28 ENCOUNTER — Ambulatory Visit: Payer: BLUE CROSS/BLUE SHIELD | Admitting: Primary Care

## 2018-08-28 VITALS — BP 110/78 | HR 69 | Temp 98.1°F | Ht 68.0 in | Wt 137.1 lb

## 2018-08-28 DIAGNOSIS — N920 Excessive and frequent menstruation with regular cycle: Secondary | ICD-10-CM | POA: Insufficient documentation

## 2018-08-28 DIAGNOSIS — N926 Irregular menstruation, unspecified: Secondary | ICD-10-CM | POA: Diagnosis not present

## 2018-08-28 DIAGNOSIS — N951 Menopausal and female climacteric states: Secondary | ICD-10-CM | POA: Insufficient documentation

## 2018-08-28 NOTE — Patient Instructions (Signed)
Stop by the lab prior to leaving today. I will notify you of your results once received.   Stop by the front desk and speak with either Rosaria Ferries regarding your referral to Gynecology.  It was a pleasure to see you today!

## 2018-08-28 NOTE — Assessment & Plan Note (Signed)
More frequent cycles every 3 weeks x 6 months, most recent lasting 3 weeks. Pelvic xam today without evidence of mass, IUD strings visible.  Differentials include perimenopause, uterine fibroids, IUD complication.   Recommended transvaginal/pelvic ultrasound for which she kindly declines. CBC pending. Referral to GYN placed as she prefers to have US done in their office.

## 2018-08-28 NOTE — Addendum Note (Signed)
Addended by: [name] on: 08/28/2018 04:12 PM   Modules accepted: Level of Service

## 2018-08-28 NOTE — Progress Notes (Signed)
Subjective:    Patient ID: Latasha Cooper, female    DOB: 1966-04-21, 52 y.o.   MRN: [id number]  HPI  Latasha Cooper is a 52 year old female who presents today with a chief complaint of irregular menstrual cycle.   She is expereincing menstrual cycles every 3 weeks on average which has been present for approximately 6 months. LMP began 3 weeks ago and hasn't stopped, today she's noticed that she's lighter. She's also waking up in the morning with mild to moderate sweats and feeling sweaty after showers. Some days she notices heavier flow in the toilet when urinating. Her cycles aren't heavy but are more than spotting. She believes her IUD was placed in 2013.   She denies fatigue, palpitations, weakness, changes in skin color, hematuria. She does not have a gynecologist.   Review of Systems  Constitutional: Negative for fatigue and unexpected weight change.  Cardiovascular: Negative for palpitations.  Genitourinary: Positive for menstrual problem.       Night sweats       Past Medical History:  Diagnosis Date  . Chicken pox   . IUD 10/162012   Dr. Rayford Halsted  . UTI (lower urinary tract infection)      Social History   Socioeconomic History  . Marital status: Married    Spouse name: Not on file  . Number of children: Not on file  . Years of education: Not on file  . Highest education level: Not on file  Occupational History  . Not on file  Social Needs  . Financial resource strain: Not on file  . Food insecurity:    Worry: Not on file    Inability: Not on file  . Transportation needs:    Medical: Not on file    Non-medical: Not on file  Tobacco Use  . Smoking status: Never Smoker  . Smokeless tobacco: Never Used  Substance and Sexual Activity  . Alcohol use: Yes  . Drug use: No  . Sexual activity: Not on file  Lifestyle  . Physical activity:    Days per week: Not on file    Minutes per session: Not on file  . Stress: Not on file  Relationships  . Social connections:      Talks on phone: Not on file    Gets together: Not on file    Attends religious service: Not on file    Active member of club or organization: Not on file    Attends meetings of clubs or organizations: Not on file    Relationship status: Not on file  . Intimate partner violence:    Fear of current or ex partner: Not on file    Emotionally abused: Not on file    Physically abused: Not on file    Forced sexual activity: Not on file  Other Topics Concern  . Not on file  Social History Narrative   Married.   2 children.    Work - [employer]   Enjoys playing tennis, reading, walking, cooking.     Past Surgical History:  Procedure Laterality Date  . VAGINAL DELIVERY     2    Family History  Problem Relation Age of Onset  . Pancreatic cancer Mother 13  . Hypertension Father   . Colon cancer Maternal Aunt     No Known Allergies  Current Outpatient Medications on File Prior to Visit  Medication Sig Dispense Refill  . IUD's (PARAGARD INTRAUTERINE COPPER IU) 1 Device by Intrauterine  route once.    . psyllium (REGULOID) 0.52 G capsule Take 0.52 g by mouth daily.     No current facility-administered medications on file prior to visit.     BP 110/78   Pulse 69   Temp 98.1 F (36.7 C)   Ht 5\' 8"  (1.727 m)   Wt 137 lb 0.8 oz (62.2 kg)   LMP 08/27/2018   SpO2 98%   BMI 20.84 kg/m    Objective:   Physical Exam  Constitutional: She appears well-nourished.  Neck: Neck supple.  Cardiovascular: Normal rate and regular rhythm.  Respiratory: Effort normal and breath sounds normal.  Genitourinary: There is no tenderness or lesion on the right labia. There is no tenderness or lesion on the left labia. Cervix exhibits no motion tenderness and no discharge. No erythema or tenderness in the vagina.  Genitourinary Comments: Scant bleeding noted. IUD strings visible and in place. No obvious masses.   Skin: Skin is warm and dry.  Psychiatric: She has a normal mood and affect.            Assessment & Plan:

## 2018-08-29 LAB — CBC
HCT: 34.4 % — ABNORMAL LOW (ref 36.0–46.0)
HEMOGLOBIN: 11.7 g/dL — AB (ref 12.0–15.0)
MCHC: 34 g/dL (ref 30.0–36.0)
MCV: 95.2 fl (ref 78.0–100.0)
PLATELETS: 202 10*3/uL (ref 150.0–400.0)
RBC: 3.61 Mil/uL — ABNORMAL LOW (ref 3.87–5.11)
RDW: 13.5 % (ref 11.5–15.5)
WBC: 6.7 10*3/uL (ref 4.0–10.5)

## 2018-09-25 ENCOUNTER — Encounter: Payer: Self-pay | Admitting: Obstetrics and Gynecology

## 2018-09-25 ENCOUNTER — Ambulatory Visit (INDEPENDENT_AMBULATORY_CARE_PROVIDER_SITE_OTHER): Payer: BLUE CROSS/BLUE SHIELD

## 2018-09-25 ENCOUNTER — Ambulatory Visit (INDEPENDENT_AMBULATORY_CARE_PROVIDER_SITE_OTHER): Payer: BLUE CROSS/BLUE SHIELD | Admitting: Obstetrics and Gynecology

## 2018-09-25 VITALS — BP 108/70 | HR 68 | Ht 68.0 in | Wt 136.0 lb

## 2018-09-25 DIAGNOSIS — D259 Leiomyoma of uterus, unspecified: Secondary | ICD-10-CM | POA: Diagnosis not present

## 2018-09-25 DIAGNOSIS — N926 Irregular menstruation, unspecified: Secondary | ICD-10-CM | POA: Diagnosis not present

## 2018-09-25 DIAGNOSIS — Z1329 Encounter for screening for other suspected endocrine disorder: Secondary | ICD-10-CM | POA: Diagnosis not present

## 2018-09-25 DIAGNOSIS — Z30431 Encounter for routine checking of intrauterine contraceptive device: Secondary | ICD-10-CM

## 2018-09-25 DIAGNOSIS — N8302 Follicular cyst of left ovary: Secondary | ICD-10-CM

## 2018-09-25 DIAGNOSIS — N938 Other specified abnormal uterine and vaginal bleeding: Secondary | ICD-10-CM | POA: Diagnosis not present

## 2018-09-25 NOTE — Progress Notes (Signed)
[name], NP   Chief Complaint  Patient presents with  . Mesntrual issues    last yr or two has noticed more bleeding, no pelvic pain    HPI:      [name] is a 52 y.o. No obstetric history on file. who LMP was Patient's last menstrual period was 09/15/2018 (exact date)., presents today for NP eval of irreg bleeding with Paragard, placed 10/03/11. Menses usually Q3-3 1/2 wks, lasting about 7 days, light flow (change BID), no BTB, no dysmen. Menses used to be closer to 4 wks, although pt doesn't track it. Had 1 mo of normal menses but then continued to have spotting/bleeding with wiping for an additional 2 wks. No recent thyroid labs. No pelvic pain. Does have occas vasomotor sx. Pt concerned about IUD placement. Does feel strings.  Pt is sex active, no dyspareunia/postcoital bleeding.   Last pap 4/18 with PCP. Current on mammo.  Past Medical History:  Diagnosis Date  . Chicken pox   . IUD 10/162012   Dr. Rayford Halsted  . UTI (lower urinary tract infection)     Past Surgical History:  Procedure Laterality Date  . VAGINAL DELIVERY     2  . WISDOM TOOTH EXTRACTION      Family History  Problem Relation Age of Onset  . Pancreatic cancer Mother 40  . Hypertension Father   . Colon cancer Maternal Aunt     Social History   Socioeconomic History  . Marital status: Married    Spouse name: Not on file  . Number of children: Not on file  . Years of education: Not on file  . Highest education level: Not on file  Occupational History  . Not on file  Social Needs  . Financial resource strain: Not on file  . Food insecurity:    Worry: Not on file    Inability: Not on file  . Transportation needs:    Medical: Not on file    Non-medical: Not on file  Tobacco Use  . Smoking status: Never Smoker  . Smokeless tobacco: Never Used  Substance and Sexual Activity  . Alcohol use: Yes  . Drug use: No  . Sexual activity: Yes    Birth control/protection: IUD   Comment: Paragard  Lifestyle  . Physical activity:    Days per week: Not on file    Minutes per session: Not on file  . Stress: Not on file  Relationships  . Social connections:    Talks on phone: Not on file    Gets together: Not on file    Attends religious service: Not on file    Active member of club or organization: Not on file    Attends meetings of clubs or organizations: Not on file    Relationship status: Not on file  . Intimate partner violence:    Fear of current or ex partner: Not on file    Emotionally abused: Not on file    Physically abused: Not on file    Forced sexual activity: Not on file  Other Topics Concern  . Not on file  Social History Narrative   Married.   2 children.    Work - [employer]   Enjoys playing tennis, reading, walking, cooking.     Outpatient Medications Prior to Visit  Medication Sig Dispense Refill  . IUD's (PARAGARD INTRAUTERINE COPPER IU) 1 Device by Intrauterine route once.    . psyllium (REGULOID) 0.52 G  capsule Take 0.52 g by mouth daily.     No facility-administered medications prior to visit.       ROS:  Review of Systems  Constitutional: Negative for fatigue, fever and unexpected weight change.  Respiratory: Negative for cough, shortness of breath and wheezing.   Cardiovascular: Negative for chest pain, palpitations and leg swelling.  Gastrointestinal: Negative for blood in stool, constipation, diarrhea, nausea and vomiting.  Endocrine: Negative for cold intolerance, heat intolerance and polyuria.  Genitourinary: Positive for menstrual problem. Negative for dyspareunia, dysuria, flank pain, frequency, genital sores, hematuria, pelvic pain, urgency, vaginal bleeding, vaginal discharge and vaginal pain.  Musculoskeletal: Negative for back pain, joint swelling and myalgias.  Skin: Negative for rash.  Neurological: Negative for dizziness, syncope, light-headedness, numbness and headaches.  Hematological: Negative for  adenopathy.  Psychiatric/Behavioral: Negative for agitation, confusion, sleep disturbance and suicidal ideas. The patient is not nervous/anxious.     OBJECTIVE:   Vitals:  BP 108/70   Pulse 68   Ht 5\' 8"  (1.727 m)   Wt 136 lb (61.7 kg)   LMP 09/15/2018 (Exact Date)   BMI 20.68 kg/m   Physical Exam  Constitutional: She is oriented to person, place, and time. Vital signs are normal. She appears well-developed.  Pulmonary/Chest: Effort normal.  Abdominal: Soft. She exhibits no distension and no mass. There is no tenderness.  Genitourinary: Vagina normal and uterus normal. There is no rash, tenderness or lesion on the right labia. There is no rash, tenderness or lesion on the left labia. Uterus is not enlarged and not tender. Cervix exhibits no motion tenderness. Right adnexum displays no mass and no tenderness. Left adnexum displays no mass and no tenderness. No erythema or tenderness in the vagina. No vaginal discharge found.  Genitourinary Comments: IUD STRINGS IN CX OS  Musculoskeletal: Normal range of motion.  Neurological: She is alert and oriented to person, place, and time.  Psychiatric: She has a normal mood and affect. Her behavior is normal. Thought content normal.  Vitals reviewed.   Results:  Patient Name: Latasha Cooper DOB: October 12, 1966 MRN: [id number] ULTRASOUND REPORT  Location: [facility]  Date of Service: 09/25/2018     Indications: Abnormal uterine bleeding Findings:  The uterus is anteverted and measures 9.1 x 5.3 x 6.0cm. Echo texture is homogenous with evidence of focal masses. Within the uterus are multiple suspected fibroids measuring: Fibroid 1: 5.1 x 4.7 x 4.5cm (left/mid, intramural vs Submucosal)  The Endometrium measures 6.8 mm. IUD in place.  Right Ovary measures 2.9 x 1.9 x 1.9 cm. It is normal in appearance. Left Ovary measures 3.6 x 2.7 x 2.2 cm. It is normal in appearance with dominant follicle seen. Survey of the adnexa  demonstrates no adnexal masses. There is no free fluid in the cul de sac.  Impression: 1. IUD in place 2. Intramural vs Submucosal fibroid measuring 5.1 cm  Vita Barley, RDMS RVT  The ultrasound images and findings were reviewed by me and I agree with the above report.  Prentice Docker, MD, [facility], [facility] 09/25/2018 3:25 PM      Assessment/Plan: DUB (dysfunctional uterine bleeding) - 1 episode. Check labs and GYN u/s (pt pref). Neg GYN u/s except small leio. If labs neg, can follow sx. Pt not interested in hormones.  - Plan: TSH, Prolactin  Encounter for routine checking of intrauterine contraceptive device (IUD) - IUD in place per u/s. - Plan: US PELVIS TRANSVANGINAL NON-OB (TV ONLY)  Return if symptoms worsen or fail to improve.  Shadrach Bartunek B. Rui Wordell, PA-C 09/25/2018 4:01 PM

## 2018-09-25 NOTE — Patient Instructions (Signed)
I value your feedback and entrusting us with your care. If you get a [facility] patient survey, I would appreciate you taking the time to let us know about your experience today. Thank you! 

## 2018-09-26 ENCOUNTER — Encounter: Payer: Self-pay | Admitting: Obstetrics and Gynecology

## 2018-09-26 LAB — PROLACTIN: Prolactin: 10.5 ng/mL (ref 4.8–23.3)

## 2018-09-26 LAB — TSH: TSH: 2.48 u[IU]/mL (ref 0.450–4.500)

## 2019-01-24 ENCOUNTER — Ambulatory Visit: Payer: BLUE CROSS/BLUE SHIELD | Admitting: Primary Care

## 2019-09-01 ENCOUNTER — Other Ambulatory Visit: Payer: Self-pay | Admitting: Primary Care

## 2019-09-01 DIAGNOSIS — Z1231 Encounter for screening mammogram for malignant neoplasm of breast: Secondary | ICD-10-CM

## 2019-09-24 ENCOUNTER — Encounter: Payer: BLUE CROSS/BLUE SHIELD | Admitting: Primary Care

## 2019-10-02 ENCOUNTER — Ambulatory Visit
Admission: RE | Admit: 2019-10-02 | Discharge: 2019-10-02 | Disposition: A | Discharge status: Home / Self Care | Payer: BC Managed Care – PPO | Source: Ambulatory Visit | Attending: Primary Care | Admitting: Primary Care

## 2019-10-02 DIAGNOSIS — Z1231 Encounter for screening mammogram for malignant neoplasm of breast: Secondary | ICD-10-CM

## 2020-01-08 ENCOUNTER — Ambulatory Visit: Payer: BC Managed Care – PPO | Admitting: Primary Care

## 2020-01-18 DIAGNOSIS — Z23 Encounter for immunization: Secondary | ICD-10-CM | POA: Diagnosis not present

## 2020-02-11 ENCOUNTER — Encounter: Payer: Self-pay | Admitting: Primary Care

## 2020-02-11 ENCOUNTER — Ambulatory Visit (INDEPENDENT_AMBULATORY_CARE_PROVIDER_SITE_OTHER): Payer: BC Managed Care – PPO | Admitting: Primary Care

## 2020-02-11 ENCOUNTER — Other Ambulatory Visit: Payer: Self-pay

## 2020-02-11 VITALS — BP 108/72 | HR 82 | Temp 96.3°F | Ht 68.0 in | Wt 141.5 lb

## 2020-02-11 DIAGNOSIS — Z1211 Encounter for screening for malignant neoplasm of colon: Secondary | ICD-10-CM | POA: Diagnosis not present

## 2020-02-11 DIAGNOSIS — N920 Excessive and frequent menstruation with regular cycle: Secondary | ICD-10-CM

## 2020-02-11 DIAGNOSIS — Z0000 Encounter for general adult medical examination without abnormal findings: Secondary | ICD-10-CM

## 2020-02-11 NOTE — Assessment & Plan Note (Addendum)
Tetanus and Shingrix due, did not provide today as she's in the middle of her Covid-19 vaccinations. She will schedule nurse visits for these later.  Mammogram UTD. Colon cancer screening due, she opts for Cologuard. Orders placed. Pap smear due in April 2021. Follows with GYN. We could do this next year at CPE if she prefers.  Encouraged a healthy diet, regular exercise. Exam today benign. Labs pending.  ECG with NSR with rate of 61. No acute ST changes, PAC/PVC. No prior ECG to compare.

## 2020-02-11 NOTE — Progress Notes (Signed)
Subjective:    Patient ID: Latasha Cooper, female    DOB: October 04, 1966, 54 y.o.   MRN: [id number]  HPI  This visit occurred during the SARS-CoV-2 public health emergency.  Safety protocols were in place, including screening questions prior to the visit, additional usage of staff PPE, and extensive cleaning of exam room while observing appropriate contact time as indicated for disinfecting solutions.   Immunizations: -Tetanus: Unsure.  -Influenza: Completed this season  -Shingles: Never completed   Diet: She endorses a fair diet.  Exercise: She walks and does strength training.   Eye exam: Completed in 2021 Dental exam: No recent exam.  Pap Smear: Completed in 2018, negative  Mammogram: Completed in October 2020 Colonoscopy: Never completed, opts for Cologuard  BP Readings from Last 3 Encounters:  02/11/20 108/72  09/25/18 108/70  08/28/18 110/78      Review of Systems  Constitutional: Negative for unexpected weight change.  HENT: Negative for rhinorrhea.   Respiratory: Negative for cough and shortness of breath.   Cardiovascular: Negative for chest pain.  Gastrointestinal: Negative for constipation and diarrhea.  Genitourinary: Negative for difficulty urinating.       Menorrhagia with regular cycle  Musculoskeletal: Negative for arthralgias.  Skin: Negative for rash.  Allergic/Immunologic: Negative for environmental allergies.  Neurological: Negative for dizziness and headaches.  Psychiatric/Behavioral: The patient is nervous/anxious.        Intermittent anxiety, feels well managed on her own.       Past Medical History:  Diagnosis Date  . Chicken pox   . IUD 10/162012   Dr. Rayford Halsted  . UTI (lower urinary tract infection)      Social History   Socioeconomic History  . Marital status: Married    Spouse name: Not on file  . Number of children: Not on file  . Years of education: Not on file  . Highest education level: Not on file  Occupational History  . Not  on file  Tobacco Use  . Smoking status: Never Smoker  . Smokeless tobacco: Never Used  Substance and Sexual Activity  . Alcohol use: Yes  . Drug use: No  . Sexual activity: Yes    Birth control/protection: I.U.D.    Comment: Paragard  Other Topics Concern  . Not on file  Social History Narrative   Married.   2 children.    Work - [employer]   Enjoys playing tennis, reading, walking, cooking.    Social Determinants of Health   Financial Resource Strain:   . Difficulty of Paying Living Expenses: Not on file  Food Insecurity:   . Worried About Charity fundraiser in the Last Year: Not on file  . Ran Out of Food in the Last Year: Not on file  Transportation Needs:   . Lack of Transportation (Medical): Not on file  . Lack of Transportation (Non-Medical): Not on file  Physical Activity:   . Days of Exercise per Week: Not on file  . Minutes of Exercise per Session: Not on file  Stress:   . Feeling of Stress : Not on file  Social Connections:   . Frequency of Communication with Friends and Family: Not on file  . Frequency of Social Gatherings with Friends and Family: Not on file  . Attends Religious Services: Not on file  . Active Member of Clubs or Organizations: Not on file  . Attends Archivist Meetings: Not on file  . Marital Status: Not on file  Intimate  Partner Violence:   . Fear of Current or Ex-Partner: Not on file  . Emotionally Abused: Not on file  . Physically Abused: Not on file  . Sexually Abused: Not on file    Past Surgical History:  Procedure Laterality Date  . VAGINAL DELIVERY     2  . WISDOM TOOTH EXTRACTION      Family History  Problem Relation Age of Onset  . Pancreatic cancer Mother 60  . Hypertension Father   . Colon cancer Maternal Aunt     No Known Allergies  Current Outpatient Medications on File Prior to Visit  Medication Sig Dispense Refill  . IUD's (PARAGARD INTRAUTERINE COPPER IU) 1 Device by Intrauterine route once.     . psyllium (REGULOID) 0.52 G capsule Take 0.52 g by mouth daily.     No current facility-administered medications on file prior to visit.    BP 108/72   Pulse 82   Temp (!) 96.3 F (35.7 C) (Temporal)   Ht 5\' 8"  (1.727 m)   Wt 141 lb 8 oz (64.2 kg)   SpO2 98%   BMI 21.52 kg/m    Objective:   Physical Exam  Constitutional: She is oriented to person, place, and time. She appears well-nourished.  HENT:  Right Ear: Tympanic membrane and ear canal normal.  Left Ear: Tympanic membrane and ear canal normal.  Mouth/Throat: Oropharynx is clear and moist.  Eyes: Pupils are equal, round, and reactive to light. EOM are normal.  Cardiovascular: Normal rate and regular rhythm.  Respiratory: Effort normal and breath sounds normal.  GI: Soft. Bowel sounds are normal. There is no abdominal tenderness.  Musculoskeletal:        General: Normal range of motion.     Cervical back: Neck supple.  Neurological: She is alert and oriented to person, place, and time. No cranial nerve deficit.  Reflex Scores:      Patellar reflexes are 2+ on the right side and 2+ on the left side. Skin: Skin is warm and dry.  Psychiatric: She has a normal mood and affect.           Assessment & Plan:

## 2020-02-11 NOTE — Patient Instructions (Signed)
Stop by the lab prior to leaving today. I will notify you of your results once received.   Complete the Cologuard Kit once received.  Continue exercising. You should be getting 150 minutes of moderate intensity exercise weekly.  Continue to work on a healthy diet. Ensure you are consuming 64 ounces of water daily.  It was a pleasure to see you today!   Preventive Care 52-52 Years Old, Female Preventive care refers to visits with your health care provider and lifestyle choices that can promote health and wellness. This includes:  A yearly physical exam. This may also be called an annual well check.  Regular dental visits and eye exams.  Immunizations.  Screening for certain conditions.  Healthy lifestyle choices, such as eating a healthy diet, getting regular exercise, not using drugs or products that contain nicotine and tobacco, and limiting alcohol use. What can I expect for my preventive care visit? Physical exam Your health care provider will check your:  Height and weight. This may be used to calculate body mass index (BMI), which tells if you are at a healthy weight.  Heart rate and blood pressure.  Skin for abnormal spots. Counseling Your health care provider may ask you questions about your:  Alcohol, tobacco, and drug use.  Emotional well-being.  Home and relationship well-being.  Sexual activity.  Eating habits.  Work and work Statistician.  Method of birth control.  Menstrual cycle.  Pregnancy history. What immunizations do I need?  Influenza (flu) vaccine  This is recommended every year. Tetanus, diphtheria, and pertussis (Tdap) vaccine  You may need a Td booster every 10 years. Varicella (chickenpox) vaccine  You may need this if you have not been vaccinated. Zoster (shingles) vaccine  You may need this after age 43. Measles, mumps, and rubella (MMR) vaccine  You may need at least one dose of MMR if you were born in 1957 or later. You  may also need a second dose. Pneumococcal conjugate (PCV13) vaccine  You may need this if you have certain conditions and were not previously vaccinated. Pneumococcal polysaccharide (PPSV23) vaccine  You may need one or two doses if you smoke cigarettes or if you have certain conditions. Meningococcal conjugate (MenACWY) vaccine  You may need this if you have certain conditions. Hepatitis A vaccine  You may need this if you have certain conditions or if you travel or work in places where you may be exposed to hepatitis A. Hepatitis B vaccine  You may need this if you have certain conditions or if you travel or work in places where you may be exposed to hepatitis B. Haemophilus influenzae type b (Hib) vaccine  You may need this if you have certain conditions. Human papillomavirus (HPV) vaccine  If recommended by your health care provider, you may need three doses over 6 months. You may receive vaccines as individual doses or as more than one vaccine together in one shot (combination vaccines). Talk with your health care provider about the risks and benefits of combination vaccines. What tests do I need? Blood tests  Lipid and cholesterol levels. These may be checked every 5 years, or more frequently if you are over 23 years old.  Hepatitis C test.  Hepatitis B test. Screening  Lung cancer screening. You may have this screening every year starting at age 71 if you have a 30-pack-year history of smoking and currently smoke or have quit within the past 15 years.  Colorectal cancer screening. All adults should have this screening  starting at age 72 and continuing until age 15. Your health care provider may recommend screening at age 37 if you are at increased risk. You will have tests every 1-10 years, depending on your results and the type of screening test.  Diabetes screening. This is done by checking your blood sugar (glucose) after you have not eaten for a while (fasting). You  may have this done every 1-3 years.  Mammogram. This may be done every 1-2 years. Talk with your health care provider about when you should start having regular mammograms. This may depend on whether you have a family history of breast cancer.  BRCA-related cancer screening. This may be done if you have a family history of breast, ovarian, tubal, or peritoneal cancers.  Pelvic exam and Pap test. This may be done every 3 years starting at age 14. Starting at age 60, this may be done every 5 years if you have a Pap test in combination with an HPV test. Other tests  Sexually transmitted disease (STD) testing.  Bone density scan. This is done to screen for osteoporosis. You may have this scan if you are at high risk for osteoporosis. Follow these instructions at home: Eating and drinking  Eat a diet that includes fresh fruits and vegetables, whole grains, lean protein, and low-fat dairy.  Take vitamin and mineral supplements as recommended by your health care provider.  Do not drink alcohol if: ? Your health care provider tells you not to drink. ? You are pregnant, may be pregnant, or are planning to become pregnant.  If you drink alcohol: ? Limit how much you have to 0-1 drink a day. ? Be aware of how much alcohol is in your drink. In the [place] one drink equals one 12 oz bottle of beer (355 mL), one 5 oz glass of wine (148 mL), or one 1 oz glass of hard liquor (44 mL). Lifestyle  Take daily care of your teeth and gums.  Stay active. Exercise for at least 30 minutes on 5 or more days each week.  Do not use any products that contain nicotine or tobacco, such as cigarettes, e-cigarettes, and chewing tobacco. If you need help quitting, ask your health care provider.  If you are sexually active, practice safe sex. Use a condom or other form of birth control (contraception) in order to prevent pregnancy and STIs (sexually transmitted infections).  If told by your health care provider,  take low-dose aspirin daily starting at age 85. What's next?  Visit your health care provider once a year for a well check visit.  Ask your health care provider how often you should have your eyes and teeth checked.  Stay up to date on all vaccines. This information is not intended to replace advice given to you by your health care provider. Make sure you discuss any questions you have with your health care provider. Document Revised: 08/15/2018 Document Reviewed: 08/15/2018 Elsevier Patient Education  2020 [employer].

## 2020-02-11 NOTE — Assessment & Plan Note (Signed)
IUD in place, overall stable. CBC pending today. Following with GYN.

## 2020-02-12 LAB — CBC
HCT: 36.7 % (ref 36.0–46.0)
Hemoglobin: 12.5 g/dL (ref 12.0–15.0)
MCHC: 34 g/dL (ref 30.0–36.0)
MCV: 96.8 fl (ref 78.0–100.0)
Platelets: 235 K/uL (ref 150.0–400.0)
RBC: 3.79 Mil/uL — ABNORMAL LOW (ref 3.87–5.11)
RDW: 12.4 % (ref 11.5–15.5)
WBC: 5.7 K/uL (ref 4.0–10.5)

## 2020-02-12 LAB — COMPREHENSIVE METABOLIC PANEL
ALT: 8 U/L (ref 0–35)
AST: 14 U/L (ref 0–37)
Albumin: 4 g/dL (ref 3.5–5.2)
Alkaline Phosphatase: 35 U/L — ABNORMAL LOW (ref 39–117)
BUN: 12 mg/dL (ref 6–23)
CO2: 29 mEq/L (ref 19–32)
Calcium: 9.2 mg/dL (ref 8.4–10.5)
Chloride: 102 mEq/L (ref 96–112)
Creatinine, Ser: 0.88 mg/dL (ref 0.40–1.20)
GFR: 67.55 mL/min (ref >60.00)
Glucose, Bld: 82 mg/dL (ref 70–99)
Potassium: 4.3 mEq/L (ref 3.5–5.1)
Sodium: 138 mEq/L (ref 135–145)
Total Bilirubin: 0.4 mg/dL (ref 0.2–1.2)
Total Protein: 6.8 g/dL (ref 6.0–8.3)

## 2020-02-12 LAB — LIPID PANEL
Cholesterol: 205 mg/dL — ABNORMAL HIGH (ref 0–200)
HDL: 60 mg/dL (ref >39.00)
LDL Cholesterol: 130 mg/dL — ABNORMAL HIGH (ref 0–99)
NonHDL: 145.31
Total CHOL/HDL Ratio: 3
Triglycerides: 77 mg/dL (ref 0.0–149.0)
VLDL: 15.4 mg/dL (ref 0.0–40.0)

## 2020-02-15 DIAGNOSIS — Z23 Encounter for immunization: Secondary | ICD-10-CM | POA: Diagnosis not present

## 2020-03-01 DIAGNOSIS — Z1211 Encounter for screening for malignant neoplasm of colon: Secondary | ICD-10-CM | POA: Diagnosis not present

## 2020-03-01 DIAGNOSIS — Z1212 Encounter for screening for malignant neoplasm of rectum: Secondary | ICD-10-CM | POA: Diagnosis not present

## 2020-03-01 LAB — COLOGUARD: Cologuard: NEGATIVE

## 2020-03-09 ENCOUNTER — Encounter: Payer: Self-pay | Admitting: Primary Care

## 2020-04-08 IMAGING — MG MM DIGITAL SCREENING BILAT W/ TOMO W/ CAD
8 series · 9 of 24 positions shown · non-contrast
Comparison: Previous exam(s).

CLINICAL DATA: Screening.

EXAM:
DIGITAL SCREENING BILATERAL MAMMOGRAM WITH TOMO AND CAD

[L CC synth-2D]
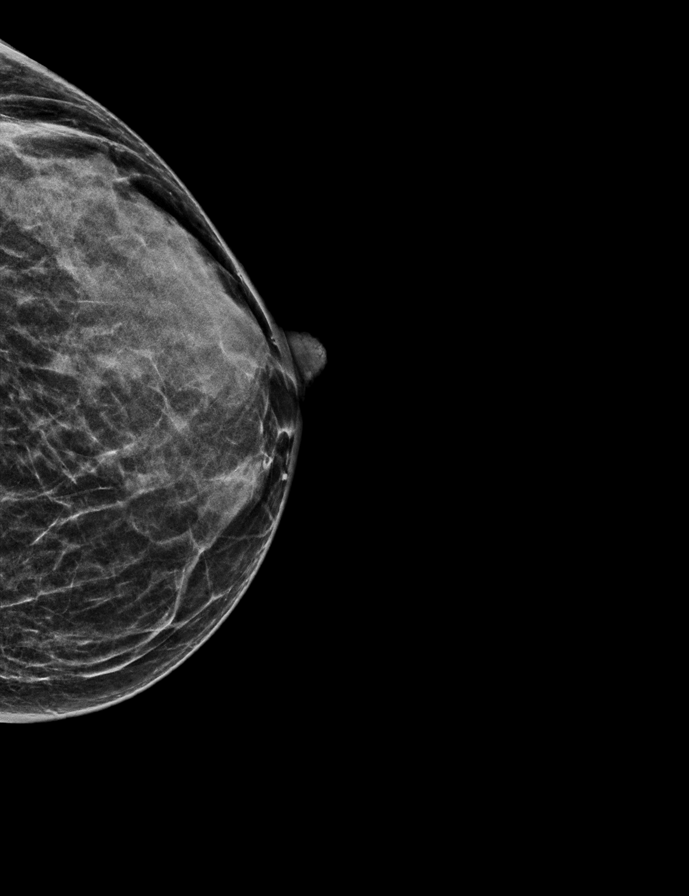

[L MLO synth-2D]
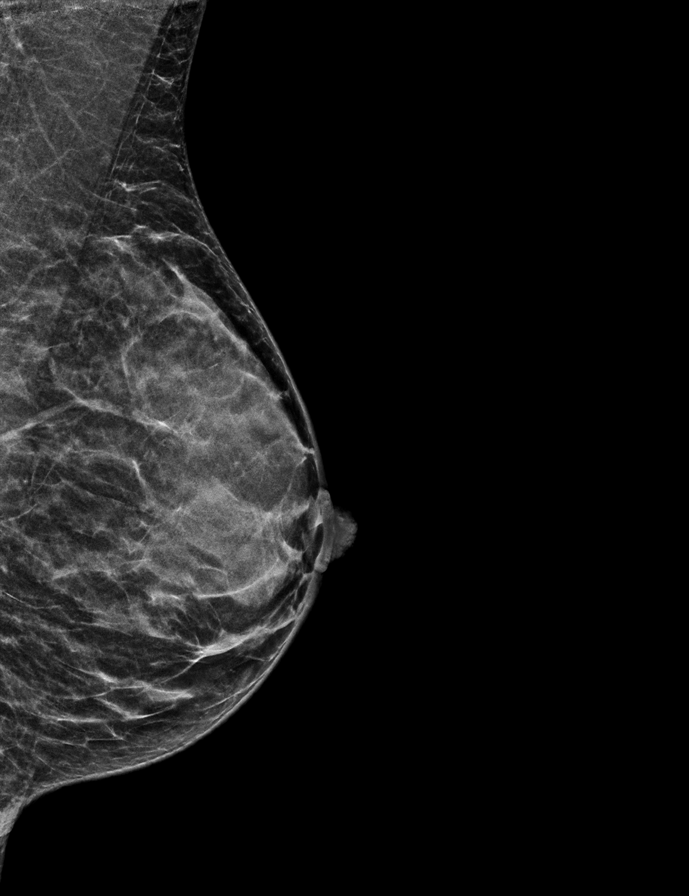

[R MLO synth-2D]
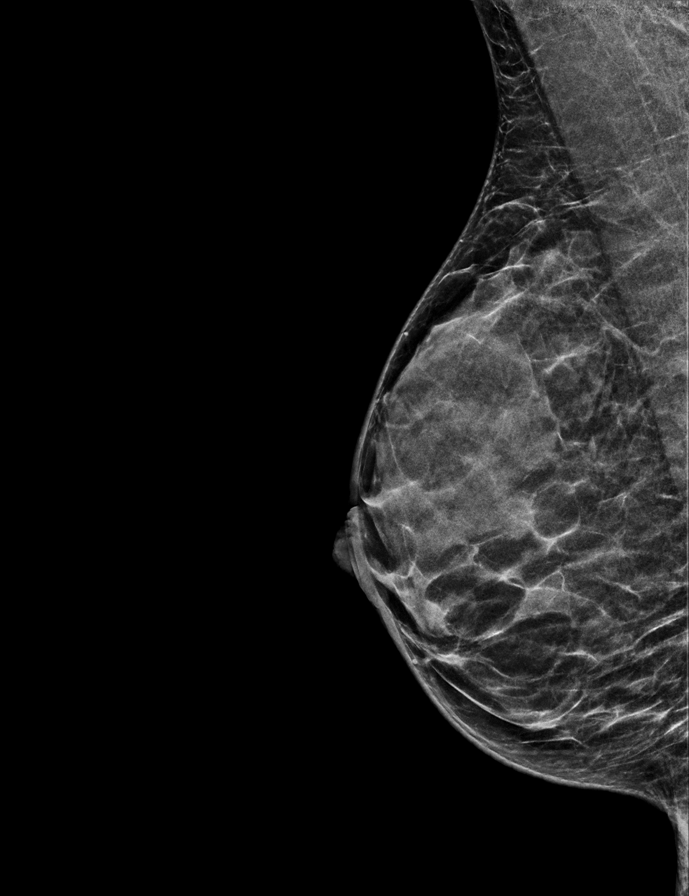

[R CC synth-2D]
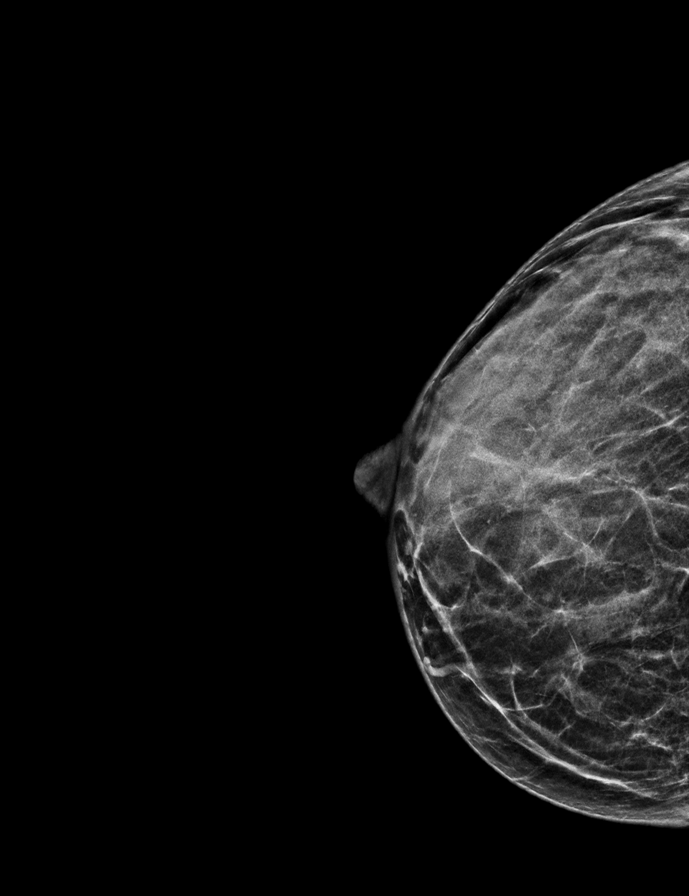

[L CC tomo · 2 of 37 frames shown]
[frame 13/37]
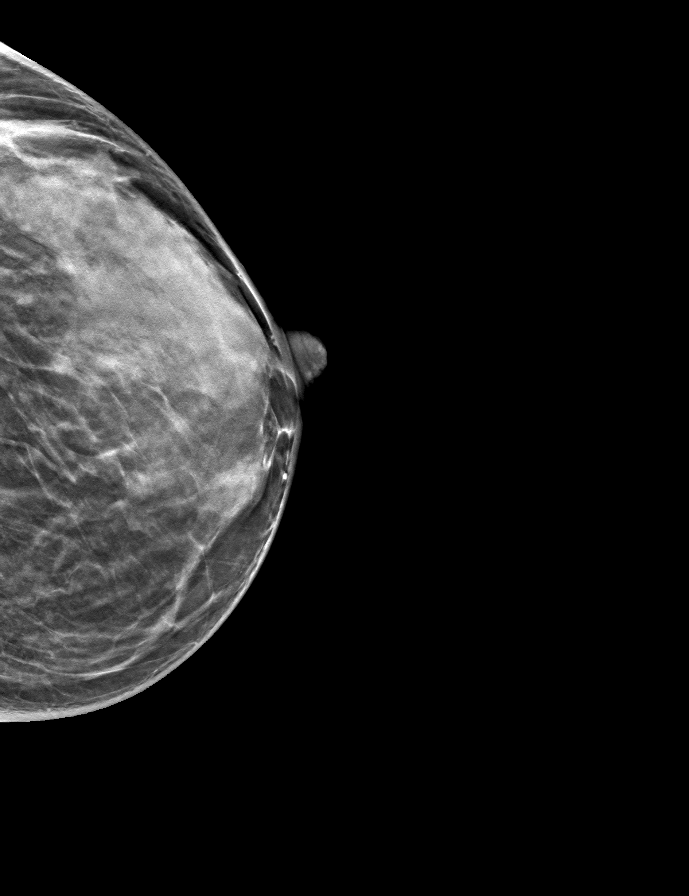
[frame 19/37]
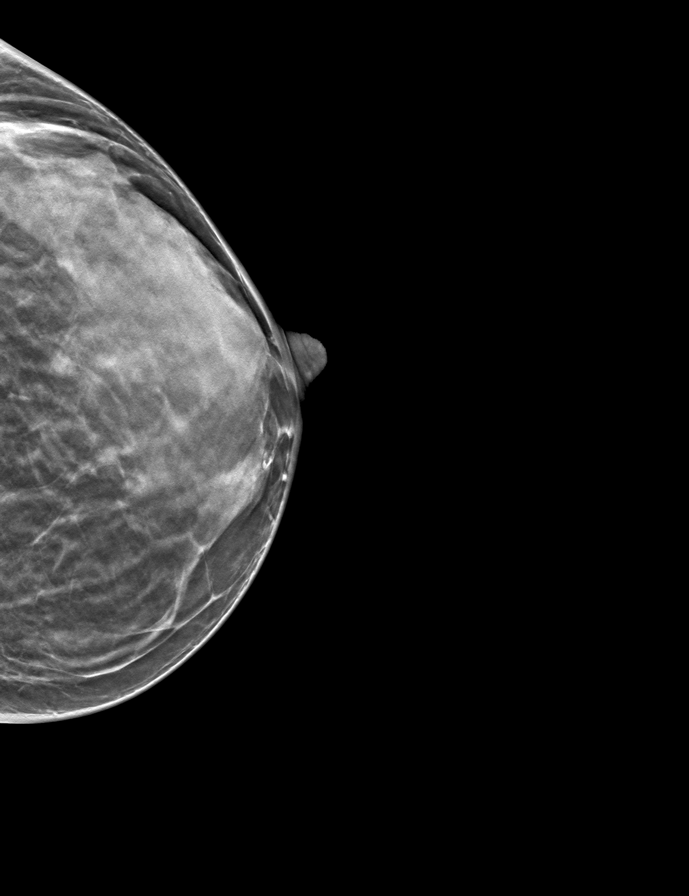

[R CC tomo · tomo slice 18/35.0]
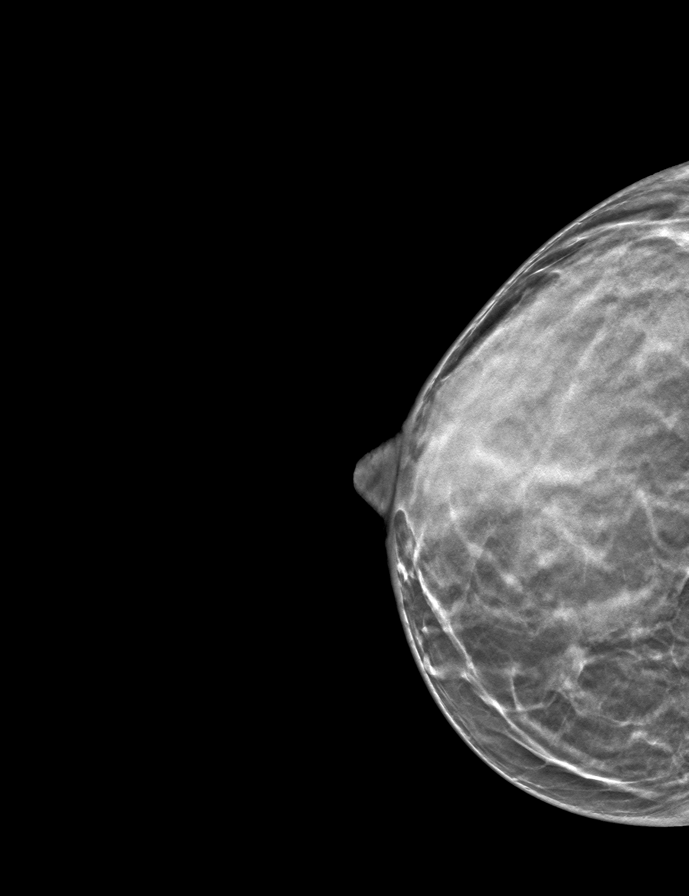

[R MLO tomo · tomo slice 19/38.0]
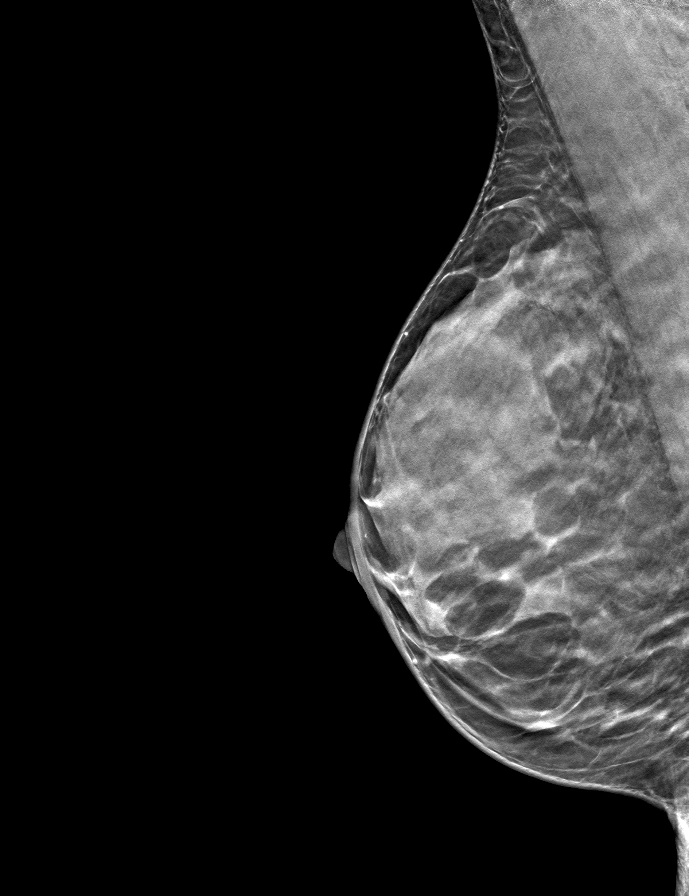

[L MLO tomo · tomo slice 20/39.0]
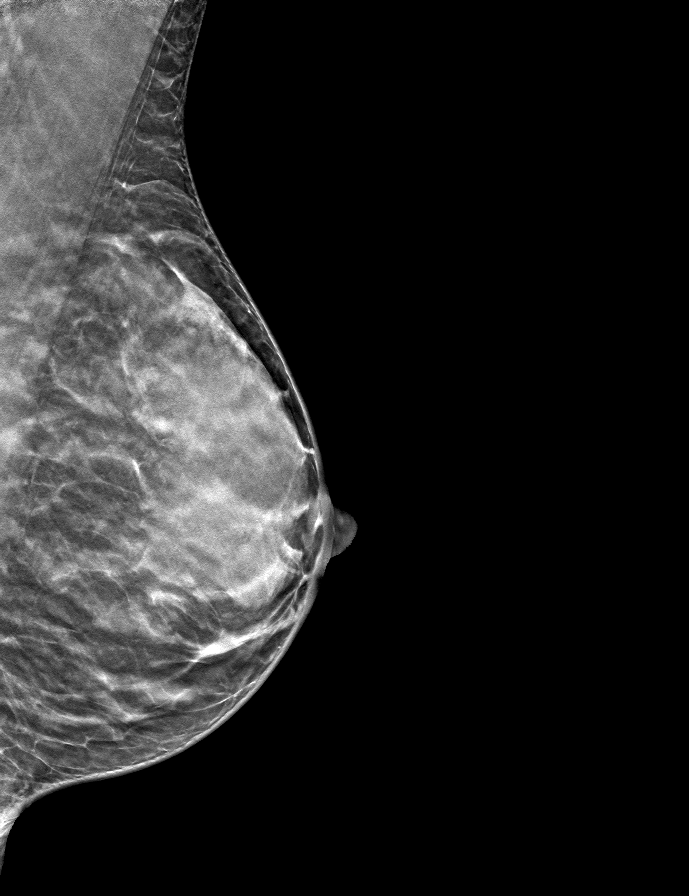

[9 of 24 positions shown; findings below may reference images not displayed]

ACR Breast Density Category c: The breast tissue is heterogeneously
dense, which may obscure small masses.
FINDINGS: There are no findings suspicious for malignancy. Images were
processed with CAD.
IMPRESSION: No mammographic evidence of malignancy. A result letter of this
screening mammogram will be mailed directly to the patient.

RECOMMENDATION:
Screening mammogram in one year. (Code:[id number])

BI-RADS CATEGORY  1: Negative.

## 2020-04-13 DIAGNOSIS — Z20828 Contact with and (suspected) exposure to other viral communicable diseases: Secondary | ICD-10-CM | POA: Diagnosis not present

## 2020-09-30 ENCOUNTER — Other Ambulatory Visit: Payer: Self-pay

## 2020-09-30 ENCOUNTER — Other Ambulatory Visit: Payer: Self-pay | Admitting: Primary Care

## 2020-09-30 ENCOUNTER — Ambulatory Visit (INDEPENDENT_AMBULATORY_CARE_PROVIDER_SITE_OTHER): Payer: BC Managed Care – PPO

## 2020-09-30 DIAGNOSIS — Z1231 Encounter for screening mammogram for malignant neoplasm of breast: Secondary | ICD-10-CM

## 2020-09-30 DIAGNOSIS — Z23 Encounter for immunization: Secondary | ICD-10-CM | POA: Diagnosis not present

## 2020-11-05 ENCOUNTER — Other Ambulatory Visit: Payer: Self-pay

## 2020-11-05 ENCOUNTER — Ambulatory Visit
Admission: RE | Admit: 2020-11-05 | Discharge: 2020-11-05 | Disposition: A | Discharge status: Home / Self Care | Payer: BC Managed Care – PPO | Source: Ambulatory Visit | Attending: Primary Care | Admitting: Primary Care

## 2020-11-05 DIAGNOSIS — Z1231 Encounter for screening mammogram for malignant neoplasm of breast: Secondary | ICD-10-CM | POA: Diagnosis not present

## 2020-12-07 ENCOUNTER — Ambulatory Visit: Payer: BC Managed Care – PPO

## 2020-12-09 ENCOUNTER — Other Ambulatory Visit: Payer: Self-pay

## 2020-12-09 ENCOUNTER — Ambulatory Visit (INDEPENDENT_AMBULATORY_CARE_PROVIDER_SITE_OTHER): Payer: BC Managed Care – PPO | Admitting: *Deleted

## 2020-12-09 DIAGNOSIS — Z23 Encounter for immunization: Secondary | ICD-10-CM

## 2020-12-09 NOTE — Progress Notes (Signed)
Per orders of Kate Clark,NP injection of 2nd Shingrix given by Yocheved Depner Simpson. Patient tolerated injection well. 

## 2021-03-10 DIAGNOSIS — D2271 Melanocytic nevi of right lower limb, including hip: Secondary | ICD-10-CM | POA: Diagnosis not present

## 2021-03-10 DIAGNOSIS — D2262 Melanocytic nevi of left upper limb, including shoulder: Secondary | ICD-10-CM | POA: Diagnosis not present

## 2021-03-10 DIAGNOSIS — D2261 Melanocytic nevi of right upper limb, including shoulder: Secondary | ICD-10-CM | POA: Diagnosis not present

## 2021-03-10 DIAGNOSIS — D225 Melanocytic nevi of trunk: Secondary | ICD-10-CM | POA: Diagnosis not present

## 2021-04-05 NOTE — Patient Instructions (Signed)
I value your feedback and you entrusting us with your care. If you get a [facility] patient survey, I would appreciate you taking the time to let us know about your experience today. Thank you! ? ? ?

## 2021-04-05 NOTE — Progress Notes (Addendum)
[name], NP   Chief Complaint  Patient presents with  . Vaginal Bleeding    Pt hasnt stopped bleeding since 3/15, no abnormal pain  . Urinary Tract Infection    Had discomfort last night urinating    HPI:      [name] is a 55 y.o. No obstetric history on file. whose LMP was Patient's last menstrual period was 03/01/2021 (exact date)., presents today for irregular bleeding this cycle. Paragard IUD placed 10/03/11. Menses are monthly, lasting 10 days, light to mod flow, changing products Q4-6 hrs, no BTB, no dysmen. LMP started on time and lasted normal 10 days, but has continued to have light spotting since, noticed with wiping. No pelvic pain. Normal TSH 10/20. Had similar sx 10/19 that resolved. Had GYN u/s at that time, with IUD in correct location and 5.1 cm leio noted. Pt is sex active, no pain/bleeding. Last pap 03/20/17, neg cells, neg HPV DNA. Has gained some wt in past 6-9 months with diet/activity changes.  Had urinary discomfort with voiding last night but sx have resolved. Hx of UTIs.  Mammos with PCP  Past Medical History:  Diagnosis Date  . Chicken pox   . IUD 10/162012   Dr. Rayford Halsted  . UTI (lower urinary tract infection)     Past Surgical History:  Procedure Laterality Date  . VAGINAL DELIVERY     2  . WISDOM TOOTH EXTRACTION      Family History  Problem Relation Age of Onset  . Pancreatic cancer Mother 32  . Hypertension Father   . Colon cancer Maternal Aunt   . Breast cancer Neg Hx     Social History   Socioeconomic History  . Marital status: Married    Spouse name: Not on file  . Number of children: Not on file  . Years of education: Not on file  . Highest education level: Not on file  Occupational History  . Not on file  Tobacco Use  . Smoking status: Never Smoker  . Smokeless tobacco: Never Used  Vaping Use  . Vaping Use: Never used  Substance and Sexual Activity  . Alcohol use: Yes  . Drug use: No  . Sexual  activity: Yes    Birth control/protection: I.U.D.    Comment: Paragard  Other Topics Concern  . Not on file  Social History Narrative   Married.   2 children.    Work - PT for [employer]   Enjoys playing tennis, reading, walking, cooking.    Social Determinants of Health   Financial Resource Strain: Not on file  Food Insecurity: Not on file  Transportation Needs: Not on file  Physical Activity: Not on file  Stress: Not on file  Social Connections: Not on file  Intimate Partner Violence: Not on file    Outpatient Medications Prior to Visit  Medication Sig Dispense Refill  . IUD's (PARAGARD INTRAUTERINE COPPER IU) 1 Device by Intrauterine route once.    . psyllium (REGULOID) 0.52 G capsule Take 0.52 g by mouth daily.     No facility-administered medications prior to visit.      ROS:  Review of Systems  Constitutional: Negative for fever.  Gastrointestinal: Negative for blood in stool, constipation, diarrhea, nausea and vomiting.  Genitourinary: Positive for menstrual problem and vaginal bleeding. Negative for dyspareunia, dysuria, flank pain, frequency, hematuria, urgency, vaginal discharge and vaginal pain.  Musculoskeletal: Negative for back pain.  Skin: Negative for rash.    OBJECTIVE:  Vitals:  BP 120/90   Ht 5\' 8"  (1.727 m)   Wt 151 lb (68.5 kg)   LMP 03/01/2021 (Exact Date)   BMI 22.96 kg/m   Physical Exam Vitals reviewed.  Constitutional:      Appearance: She is well-developed.  Pulmonary:     Effort: Pulmonary effort is normal.  Genitourinary:    General: Normal vulva.     Pubic Area: No rash.      Labia:        Right: No rash, tenderness or lesion.        Left: No rash, tenderness or lesion.      Vagina: Bleeding present. No vaginal discharge, erythema or tenderness.     Cervix: Normal.     Uterus: Normal. Not enlarged and not tender.      Adnexa: Right adnexa normal and left adnexa normal.       Right: No mass or tenderness.         Left: No  mass or tenderness.       Comments: IUD STRINGS IN CX OS; SMALL AMT BLEEDING Musculoskeletal:        General: Normal range of motion.     Cervical back: Normal range of motion.  Skin:    General: Skin is warm and dry.  Neurological:     General: No focal deficit present.     Mental Status: She is alert and oriented to person, place, and time.  Psychiatric:        Mood and Affect: Mood normal.        Behavior: Behavior normal.        Thought Content: Thought content normal.        Judgment: Judgment normal.     Results: Results for orders placed or performed in visit on 04/06/21 (from the past 24 hour(s))  POCT urine pregnancy     Status: Normal   Collection Time: 04/06/21  3:27 PM  Result Value Ref Range   Preg Test, Ur Negative Negative  POCT Urinalysis Dipstick     Status: Normal   Collection Time: 04/06/21  3:31 PM  Result Value Ref Range   Color, UA yellow    Clarity, UA clear    Glucose, UA Negative Negative   Bilirubin, UA neg    Ketones, UA neg    Spec Grav, UA 1.015 1.010 - 1.025   Blood, UA mod    pH, UA 7.5 5.0 - 8.0   Protein, UA Negative Negative   Urobilinogen, UA     Nitrite, UA neg    Leukocytes, UA Negative Negative   Appearance     Odor     VAGINAL BLEEDING ON EXAM  Assessment/Plan: Abnormal uterine bleeding (AUB) - Plan: TSH, Cytology - PAP, POCT urine pregnancy; with IUD for 1 cycle. Neg UPT, IUD strings normal length. Since first episode, most likely perimenopausal vs IUD could also be due to [facility]. Check pap. Discussed u/s now, but also ok to see if sx persist/recur. Pt to f/u prn sx for GYN u/s as needed.   Cervical cancer screening - Plan: Cytology - PAP  Screening for HPV (human papillomavirus) - Plan: Cytology - PAP  Encounter for routine checking of intrauterine contraceptive device (IUD)--due for removal 10/22. Pt to return at later date for removal. Discussed minimal likelihood of pregnancy at her age.   UTI symptoms - Plan: POCT  Urinalysis Dipstick; neg UA except blood which is vagina. Reassurance. F/u prn.     Return  if symptoms worsen or fail to improve.  Aadvik Roker B. Yuan Gann, PA-C 04/06/2021 3:33 PM

## 2021-04-06 ENCOUNTER — Ambulatory Visit: Payer: BC Managed Care – PPO | Admitting: Obstetrics and Gynecology

## 2021-04-06 ENCOUNTER — Other Ambulatory Visit (HOSPITAL_COMMUNITY)
Admission: RE | Admit: 2021-04-06 | Discharge: 2021-04-06 | Disposition: A | Discharge status: Home / Self Care | Payer: BC Managed Care – PPO | Source: Ambulatory Visit | Attending: Obstetrics and Gynecology | Admitting: Obstetrics and Gynecology

## 2021-04-06 ENCOUNTER — Encounter: Payer: Self-pay | Admitting: Obstetrics and Gynecology

## 2021-04-06 ENCOUNTER — Other Ambulatory Visit: Payer: Self-pay

## 2021-04-06 VITALS — BP 120/90 | Ht 68.0 in | Wt 151.0 lb

## 2021-04-06 DIAGNOSIS — Z30431 Encounter for routine checking of intrauterine contraceptive device: Secondary | ICD-10-CM

## 2021-04-06 DIAGNOSIS — Z1151 Encounter for screening for human papillomavirus (HPV): Secondary | ICD-10-CM

## 2021-04-06 DIAGNOSIS — R399 Unspecified symptoms and signs involving the genitourinary system: Secondary | ICD-10-CM | POA: Diagnosis not present

## 2021-04-06 DIAGNOSIS — Z124 Encounter for screening for malignant neoplasm of cervix: Secondary | ICD-10-CM | POA: Diagnosis not present

## 2021-04-06 DIAGNOSIS — N939 Abnormal uterine and vaginal bleeding, unspecified: Secondary | ICD-10-CM

## 2021-04-06 LAB — POCT URINALYSIS DIPSTICK
Bilirubin, UA: NEGATIVE
Glucose, UA: NEGATIVE
Ketones, UA: NEGATIVE
Leukocytes, UA: NEGATIVE
Nitrite, UA: NEGATIVE
Protein, UA: NEGATIVE
Spec Grav, UA: 1.015 (ref 1.010–1.025)
pH, UA: 7.5 (ref 5.0–8.0)

## 2021-04-06 LAB — POCT URINE PREGNANCY: Preg Test, Ur: NEGATIVE

## 2021-04-08 LAB — CYTOLOGY - PAP
Comment: NEGATIVE
Diagnosis: NEGATIVE
High risk HPV: NEGATIVE

## 2021-04-26 ENCOUNTER — Emergency Department: Payer: BC Managed Care – PPO

## 2021-04-26 ENCOUNTER — Emergency Department
Admission: EM | Admit: 2021-04-26 | Discharge: 2021-04-26 | Disposition: A | Discharge status: Home / Self Care | Payer: BC Managed Care – PPO | Attending: Emergency Medicine | Admitting: Emergency Medicine

## 2021-04-26 ENCOUNTER — Encounter: Payer: Self-pay | Admitting: Emergency Medicine

## 2021-04-26 ENCOUNTER — Other Ambulatory Visit: Payer: Self-pay

## 2021-04-26 DIAGNOSIS — S93401A Sprain of unspecified ligament of right ankle, initial encounter: Secondary | ICD-10-CM | POA: Diagnosis not present

## 2021-04-26 DIAGNOSIS — W2112XA Struck by tennis racquet, initial encounter: Secondary | ICD-10-CM | POA: Insufficient documentation

## 2021-04-26 DIAGNOSIS — Y9373 Activity, racquet and hand sports: Secondary | ICD-10-CM | POA: Diagnosis not present

## 2021-04-26 DIAGNOSIS — M7989 Other specified soft tissue disorders: Secondary | ICD-10-CM | POA: Diagnosis not present

## 2021-04-26 DIAGNOSIS — S82821A Torus fracture of lower end of right fibula, initial encounter for closed fracture: Secondary | ICD-10-CM | POA: Diagnosis not present

## 2021-04-26 DIAGNOSIS — S82831A Other fracture of upper and lower end of right fibula, initial encounter for closed fracture: Secondary | ICD-10-CM | POA: Diagnosis not present

## 2021-04-26 DIAGNOSIS — S99911A Unspecified injury of right ankle, initial encounter: Secondary | ICD-10-CM | POA: Diagnosis not present

## 2021-04-26 NOTE — ED Triage Notes (Signed)
Pt to triage via w/c with no distress noted; pt reports PTA rolling rt ankle playing tennis

## 2021-04-26 NOTE — Discharge Instructions (Addendum)
You are being treated for a mildly displaced fracture to the distal fibula of the ankle.  This injury will likely heal without surgical intervention.  You are being placed in a walking boot to promote healing.  You should ambulate (using crutches) with weightbearing as tolerated.  You may follow-up with podiatry for further fracture care.  Rest with the foot elevated and apply ice over the splint to help reduce swelling.  Return to the ED if necessary.

## 2021-04-26 NOTE — ED Provider Notes (Signed)
[facility] Emergency Department Provider Note ____________________________________________  Time seen: 2151  I have reviewed the triage vital signs and the nursing notes.  HISTORY  Chief Complaint  Ankle Pain  HPI Latasha Cooper is a 55 y.o. female presents to the ED for evaluation of right ankle pain.  Patient ports a mechanical injury after she rolled her ankle while playing tennis prior to arrival.  She denies any other injury at this time.   Past Medical History:  Diagnosis Date  . Chicken pox   . IUD 10/162012   Dr. Rayford Halsted  . UTI (lower urinary tract infection)     Patient Active Problem List   Diagnosis Date Noted  . Menorrhagia with regular cycle 08/28/2018  . Routine general medical examination at a health care facility 02/27/2014  . Screening for breast cancer 08/08/2012    Past Surgical History:  Procedure Laterality Date  . VAGINAL DELIVERY     2  . WISDOM TOOTH EXTRACTION      Prior to Admission medications   Medication Sig Start Date End Date Taking? Authorizing Provider  IUD's [facility] INTRAUTERINE COPPER IU) 1 Device by Intrauterine route once.    [provider]  psyllium (REGULOID) 0.52 G capsule Take 0.52 g by mouth daily.    [provider]    Allergies Patient has no known allergies.  Family History  Problem Relation Age of Onset  . Pancreatic cancer Mother 54  . Hypertension Father   . Colon cancer Maternal Aunt   . Breast cancer Neg Hx     Social History Social History   Tobacco Use  . Smoking status: Never Smoker  . Smokeless tobacco: Never Used  Vaping Use  . Vaping Use: Never used  Substance Use Topics  . Alcohol use: Yes  . Drug use: No    Review of Systems  Constitutional: Negative for fever. Cardiovascular: Negative for chest pain. Respiratory: Negative for shortness of breath. Gastrointestinal: Negative for abdominal pain, vomiting and diarrhea. Genitourinary: Negative for  dysuria. Musculoskeletal: Negative for back pain. Right ankle pain as above Skin: Negative for rash. Neurological: Negative for headaches, focal weakness or numbness. ____________________________________________  PHYSICAL EXAM:  VITAL SIGNS: ED Triage Vitals [04/26/21 2141]  Enc Vitals Group     BP      Pulse      Resp      Temp      Temp src      SpO2      Weight 145 lb (65.8 kg)     Height 5\' 8"  (1.727 m)     Head Circumference      Peak Flow      Pain Score 6     Pain Loc      Pain Edu?      Excl. in [place]?    Constitutional: Alert and oriented. Well appearing and in no distress. Head: Normocephalic and atraumatic. Eyes: Conjunctivae are normal. Normal extraocular movements Cardiovascular: Normal rate, regular rhythm. Normal distal pulses. Respiratory: Normal respiratory effort.  Musculoskeletal: Right ankle with obvious lateral soft tissue swelling on exam.  Patient with normal active range of motion to the ankle.  Tender to palpation of the lateral malleolus.  Nontender with normal range of motion in all extremities.  Neurologic:  Normal gross sensation.  Normal speech and language. No gross focal neurologic deficits are appreciated. Skin:  Skin is warm, dry and intact. No rash noted.  Right knee abrasion noted. ____________________________________________   [id number]  DG Right Ankle   IMPRESSION: Acute mildly displaced distal fibular fracture  I, Amar Keenum V Bacon-Vanissa Strength, personally viewed and evaluated these images (plain radiographs) as part of my medical decision making, as well as reviewing the written report by the radiologist. ____________________________________________  PROCEDURES  .Splint Application  Date/Time: 04/26/2021 10:49 PM Performed by: Melvenia Needles, PA-C Authorized by: Melvenia Needles, PA-C   Consent:    Consent obtained:  Verbal   Consent given by:  Patient   Risks, benefits, and alternatives were discussed: yes      Risks discussed:  Swelling and discoloration Universal protocol:    Procedure explained and questions answered to patient or proxy's satisfaction: yes     Imaging studies available: yes     Patient identity confirmed:  Verbally with patient Pre-procedure details:    Distal neurologic exam:  Normal   Distal perfusion: distal pulses strong   Procedure details:    Location:  Ankle   Ankle location:  R ankle   Strapping: no     Lower extremity splint type: CAM BOOT.   Supplies:  Prefabricated splint Post-procedure details:    Distal neurologic exam:  Normal   Distal perfusion: distal pulses strong     Procedure completion:  Tolerated well, no immediate complications   Post-procedure imaging: not applicable     ____________________________________________   INITIAL IMPRESSION / ASSESSMENT AND PLAN / ED COURSE  As part of my medical decision making, I reviewed the following data within the [place] reviewed distal fibular fracture and Notes from prior ED visits   Patient with ED evaluation initial fracture care of a closed mildly displaced, mildly comminuted distal fibula fracture.  He is placed in a cam walker.Marland Kitchen  She will follow-up with podiatry for further fracture care.  Patient is inclined to take over-the-counter Tylenol as needed for pain relief.  RICE instructions are provided.  Return precautions have also been discussed.  Latasha Cooper was evaluated in Emergency Department on 04/26/2021 for the symptoms described in the history of present illness. She was evaluated in the context of the global COVID-19 pandemic, which necessitated consideration that the patient might be at risk for infection with the SARS-CoV-2 virus that causes COVID-19. Institutional protocols and algorithms that pertain to the evaluation of patients at risk for COVID-19 are in a state of rapid change based on information released by regulatory bodies including the CDC and federal and  state organizations. These policies and algorithms were followed during the patient's care in the ED. ____________________________________________  FINAL CLINICAL IMPRESSION(S) / ED DIAGNOSES  Final diagnoses:  Sprain of right ankle, unspecified ligament, initial encounter  Closed torus fracture of distal end of right fibula, initial encounter      Melvenia Needles, PA-C 04/26/21 2252    Nance Pear, MD 04/26/21 2316

## 2021-05-09 ENCOUNTER — Ambulatory Visit: Payer: BC Managed Care – PPO | Admitting: Podiatry

## 2021-05-10 ENCOUNTER — Other Ambulatory Visit: Payer: Self-pay

## 2021-05-10 ENCOUNTER — Encounter: Payer: Self-pay | Admitting: Podiatry

## 2021-05-10 ENCOUNTER — Ambulatory Visit: Payer: BC Managed Care – PPO | Admitting: Podiatry

## 2021-05-10 DIAGNOSIS — S8264XA Nondisplaced fracture of lateral malleolus of right fibula, initial encounter for closed fracture: Secondary | ICD-10-CM

## 2021-05-10 NOTE — Progress Notes (Signed)
   HPI: 55 y.o. female presenting today presenting today as a new patient for evaluation of an ankle fracture that the patient sustained on 04/26/2021 while playing tennis.  She sustained a trip and fall injury and went to the emergency department at [facility] where she was diagnosed with a minimally displaced fracture of the fibular malleolus.  She has been minimally weightbearing in a cam boot mostly with the assistance of crutches.  She states over the last 48 hours she feels much better and has no pain.  She presents for further treatment and evaluation  Past Medical History:  Diagnosis Date  . Chicken pox   . IUD 10/162012   Dr. Rayford Halsted  . UTI (lower urinary tract infection)      Physical Exam: General: The patient is alert and oriented x3 in no acute distress.  Dermatology: Skin is warm, dry and supple bilateral lower extremities. Negative for open lesions or macerations.  Vascular: Palpable pedal pulses bilaterally.  No ecchymosis.  Minimal edema.  Capillary refill within normal limits.  Neurological: Epicritic and protective threshold grossly intact bilaterally.   Musculoskeletal Exam: Range of motion within normal limits to all pedal and ankle joints bilateral. Muscle strength 5/5 in all groups bilateral.  There is no pain on palpation throughout the lateral malleolus.  Radiographic Exam taken 04/26/2021 [facility] ED:  Acute fracture involving the distal fibula with less than 1/4 shaft diameter lateral displacement of distal fracture fragment. Ankle mortise grossly symmetric. Lateral soft tissue swelling.  Assessment: 1.  Minimally displaced fibular fracture right ankle   Plan of Care:  1. Patient evaluated. X-Rays reviewed that were taken in the ED.  2.  Continue strict nonweightbearing for an additional 2-3 weeks. 3.  Continue immobilization cam boot with an Ace wrap 4.  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 Edrick Kins, DPM    [address], [place] [address]                Office [phone]  Fax [phone]

## 2021-05-24 ENCOUNTER — Other Ambulatory Visit: Payer: Self-pay

## 2021-05-24 ENCOUNTER — Ambulatory Visit: Payer: BC Managed Care – PPO | Admitting: Family Medicine

## 2021-05-24 ENCOUNTER — Encounter: Payer: Self-pay | Admitting: Family Medicine

## 2021-05-24 VITALS — BP 120/80 | HR 71 | Temp 97.8°F | Wt 145.0 lb

## 2021-05-24 DIAGNOSIS — H60391 Other infective otitis externa, right ear: Secondary | ICD-10-CM | POA: Diagnosis not present

## 2021-05-24 MED ORDER — NEOMYCIN-POLYMYXIN-HC 3.5-10000-1 OT SOLN: 4.0000 [drp] | Freq: Four times a day (QID) | OTIC | 0 refills | Status: AC

## 2021-05-24 NOTE — Progress Notes (Signed)
Subjective:     Latasha Cooper is a 55 y.o. female presenting for Ear Pain (R X 1 day of pain. Fullness started on Saturday )     Otalgia  There is pain in the right ear. This is a new problem. The current episode started in the past 7 days (05/22/2021). The problem occurs constantly. The problem has been gradually worsening. There has been no fever. The pain is moderate. Associated symptoms include ear discharge. Pertinent negatives include no coughing, diarrhea, headaches, hearing loss, neck pain, rhinorrhea, sore throat or vomiting. She has tried NSAIDs for the symptoms. The treatment provided moderate relief.   Thinks a scab got pushed in Tried to clean the ears and got extra water in the ears   Review of Systems  Constitutional: Negative for chills and fever.  HENT: Positive for ear discharge and ear pain. Negative for hearing loss, rhinorrhea, sore throat and tinnitus.   Respiratory: Negative for cough.   Gastrointestinal: Negative for diarrhea and vomiting.  Musculoskeletal: Negative for neck pain.  Neurological: Negative for headaches.     Social History   Tobacco Use  Smoking Status Never Smoker  Smokeless Tobacco Never Used        Objective:    BP Readings from Last 3 Encounters:  05/24/21 120/80  04/26/21 (!) 149/84  04/06/21 120/90   Wt Readings from Last 3 Encounters:  05/24/21 145 lb (65.8 kg)  04/26/21 145 lb (65.8 kg)  04/06/21 151 lb (68.5 kg)    BP 120/80   Pulse 71   Temp 97.8 F (36.6 C) (Temporal)   Wt 145 lb (65.8 kg)   LMP 04/26/2021 (Exact Date)   SpO2 100%   BMI 22.05 kg/m    Physical Exam Constitutional:      General: She is not in acute distress.    Appearance: She is well-developed. She is not diaphoretic.  HENT:     Head: Normocephalic and atraumatic.     Right Ear: Drainage and swelling present. Tympanic membrane is erythematous.     Left Ear: Tympanic membrane and external ear normal.     Nose: Nose normal.  Eyes:      Conjunctiva/sclera: Conjunctivae normal.  Cardiovascular:     Rate and Rhythm: Normal rate.  Pulmonary:     Effort: Pulmonary effort is normal.  Musculoskeletal:     Cervical back: Neck supple.  Skin:    General: Skin is warm and dry.     Capillary Refill: Capillary refill takes less than 2 seconds.  Neurological:     Mental Status: She is alert. Mental status is at baseline.  Psychiatric:        Mood and Affect: Mood normal.        Behavior: Behavior normal.           Assessment & Plan:   Problem List Items Addressed This Visit   None   Visit Diagnoses    Acute infective otitis externa, right    -  Primary   Relevant Medications   neomycin-polymyxin-hydrocortisone (CORTISPORIN) OTIC solution     Swelling of the ear canal Advised calling if not improving with drops    Return if symptoms worsen or fail to improve.  Lesleigh Noe, MD  This visit occurred during the SARS-CoV-2 public health emergency.  Safety protocols were in place, including screening questions prior to the visit, additional usage of staff PPE, and extensive cleaning of exam room while observing appropriate contact time as indicated for  disinfecting solutions.

## 2021-05-24 NOTE — Patient Instructions (Signed)
#   ear infection - drops in ear - call if not improving or worsening

## 2021-05-27 ENCOUNTER — Ambulatory Visit (INDEPENDENT_AMBULATORY_CARE_PROVIDER_SITE_OTHER): Payer: BC Managed Care – PPO

## 2021-05-27 ENCOUNTER — Encounter: Payer: Self-pay | Admitting: Podiatry

## 2021-05-27 ENCOUNTER — Ambulatory Visit: Payer: BC Managed Care – PPO | Admitting: Podiatry

## 2021-05-27 ENCOUNTER — Other Ambulatory Visit: Payer: Self-pay

## 2021-05-27 DIAGNOSIS — S8264XA Nondisplaced fracture of lateral malleolus of right fibula, initial encounter for closed fracture: Secondary | ICD-10-CM

## 2021-05-27 NOTE — Progress Notes (Signed)
   HPI: 55 y.o. female presenting today for follow-up evaluation of an ankle fracture that the patient sustained on 04/26/2021 while playing tennis.  She sustained a trip and fall injury and went to the emergency department at [facility] where she was diagnosed with a minimally displaced fracture of the fibular malleolus.    Patient states that she is doing very well.  She has no pain.  She has been using the knee scooter and nonweightbearing for 2 weeks.  She presents for further treatment and evaluation  Past Medical History:  Diagnosis Date   Chicken pox    IUD 10/162012   Dr. Rayford Halsted   UTI (lower urinary tract infection)      Physical Exam: General: The patient is alert and oriented x3 in no acute distress.  Dermatology: Skin is warm, dry and supple bilateral lower extremities. Negative for open lesions or macerations.  Vascular: Palpable pedal pulses bilaterally.  No ecchymosis.  Minimal edema.  Capillary refill within normal limits.  Neurological: Epicritic and protective threshold grossly intact bilaterally.   Musculoskeletal Exam: Range of motion within normal limits to all pedal and ankle joints bilateral. Muscle strength 5/5 in all groups bilateral.  There is no pain on palpation throughout the lateral malleolus.  Radiographic Exam taken 04/26/2021 [facility] ED:  Acute fracture involving the distal fibula with less than 1/4 shaft diameter lateral displacement of distal fracture fragment. Ankle mortise grossly symmetric. Lateral soft tissue swelling.  Radiographic exam: No significant changes since prior x-rays.  There may be some very mild shifting however the fracture fragment appears very stable with slight evidence of callus formation.  Ankle mortise continues to be grossly symmetric  Assessment: 1.  Minimally displaced fibular fracture right ankle   Plan of Care:  1. Patient evaluated.  X-rays reviewed again today and compared with prior x-rays taken in the ED. 2.  Overall  patient is feeling much better.  She has approximately 1 month after her initial injury.  She may begin very light weightbearing in the cam boot.  Continue the knee scooter as needed 3.  Continue Ace wrap's daily 4.  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 Edrick Kins, DPM    [address], [place] [address]                Office [phone]  Fax [phone]

## 2021-06-28 ENCOUNTER — Ambulatory Visit (INDEPENDENT_AMBULATORY_CARE_PROVIDER_SITE_OTHER): Payer: BC Managed Care – PPO

## 2021-06-28 ENCOUNTER — Other Ambulatory Visit: Payer: Self-pay

## 2021-06-28 ENCOUNTER — Ambulatory Visit: Payer: BC Managed Care – PPO | Admitting: Podiatry

## 2021-06-28 DIAGNOSIS — S8264XA Nondisplaced fracture of lateral malleolus of right fibula, initial encounter for closed fracture: Secondary | ICD-10-CM | POA: Diagnosis not present

## 2021-06-28 DIAGNOSIS — S8264XD Nondisplaced fracture of lateral malleolus of right fibula, subsequent encounter for closed fracture with routine healing: Secondary | ICD-10-CM | POA: Diagnosis not present

## 2021-06-28 NOTE — Progress Notes (Signed)
   HPI: 55 y.o. female presenting today for follow-up evaluation of an ankle fracture that the patient sustained on 04/26/2021 while playing tennis.  She sustained a trip and fall injury and went to the emergency department at [facility] where she was diagnosed with a minimally displaced fracture of the fibular malleolus.    Patient continues to have no pain.  She is been weightbearing in the cam boot as instructed.  She is anxious to get out of the boot but overall she is doing very well.  Past Medical History:  Diagnosis Date   Chicken pox    IUD 10/162012   Dr. Rayford Halsted   UTI (lower urinary tract infection)      Physical Exam: General: The patient is alert and oriented x3 in no acute distress.  Dermatology: Skin is warm, dry and supple bilateral lower extremities. Negative for open lesions or macerations.  Vascular: Palpable pedal pulses bilaterally.  No ecchymosis.  Minimal edema.  Capillary refill within normal limits.  Neurological: Epicritic and protective threshold grossly intact bilaterally.   Musculoskeletal Exam: Range of motion within normal limits to all pedal and ankle joints bilateral. Muscle strength 5/5 in all groups bilateral.  There is no pain on palpation throughout the lateral malleolus.  Radiographic Exam taken 04/26/2021 [facility] ED:  Acute fracture involving the distal fibula with less than 1/4 shaft diameter lateral displacement of distal fracture fragment. Ankle mortise grossly symmetric. Lateral soft tissue swelling.  Radiographic exam: No significant changes since prior x-rays.  There actually appears to be better alignment today.  There is some callus formation around the fracture site.  Good alignment of the fibula.  Assessment: 1.  Minimally displaced fibular fracture right ankle, subsequent encounter with routine healing   Plan of Care:  1. Patient evaluated.  X-rays reviewed again today and compared with prior x-rays taken in the ED. 2.  Patient continues to  do very well.  She is nonweightbearing in the cam boot without any pain.  The fracture is now 2 months old and appears very stable 3.  Patient may discontinue cam boot. 4.  Ankle brace dispensed.  Recommend ankle brace with tennis shoes daily 5.  Patient may begin walking and passive range of motion exercises  6.  Refrain from any dynamic or explosive type exercises  7.  Return to clinic 6 weeks for final follow-up x-ray and to begin more dynamic exercises and movements such as tennis.  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 Edrick Kins, DPM    [address], [place] [address]                Office [phone]  Fax [phone]

## 2021-07-01 ENCOUNTER — Ambulatory Visit: Payer: BC Managed Care – PPO | Admitting: Podiatry

## 2021-11-16 ENCOUNTER — Ambulatory Visit: Payer: BC Managed Care – PPO | Admitting: Obstetrics and Gynecology

## 2021-11-28 ENCOUNTER — Encounter: Payer: Self-pay | Admitting: Obstetrics and Gynecology

## 2021-11-28 ENCOUNTER — Ambulatory Visit: Payer: BC Managed Care – PPO | Admitting: Obstetrics and Gynecology

## 2021-11-28 ENCOUNTER — Other Ambulatory Visit: Payer: Self-pay

## 2021-11-28 VITALS — BP 120/80 | Ht 68.0 in | Wt 150.0 lb

## 2021-11-28 DIAGNOSIS — Z30432 Encounter for removal of intrauterine contraceptive device: Secondary | ICD-10-CM | POA: Diagnosis not present

## 2021-11-28 DIAGNOSIS — R159 Full incontinence of feces: Secondary | ICD-10-CM | POA: Diagnosis not present

## 2021-11-28 DIAGNOSIS — Z800 Family history of malignant neoplasm of digestive organs: Secondary | ICD-10-CM | POA: Diagnosis not present

## 2021-11-28 DIAGNOSIS — N951 Menopausal and female climacteric states: Secondary | ICD-10-CM

## 2021-11-28 NOTE — Progress Notes (Signed)
Latasha Koch, NP   Chief Complaint  Patient presents with   IUD removal    HPI:      [name] is a 55 y.o. [id number] whose LMP was No LMP recorded. (Menstrual status: IUD)., presents today for IUD removal. Paragard placed 10 yrs ago. Menses are monthly, lasting 8-10 days, mod flow, no BTB, occas dysmen. Pt thinks paragard IUD contributes to length and heaviness of flow. Hx of 5.1 x 4.7 cm leio on 10/19 GYN u/s. Pt is sex active, no pain/bleeding. Partner may get vasectomy.  Last pap 04/06/21 was neg pap/neg HPV DNA.   Pt with issues with passive fecal incont now. Stool is formed and a small amount comes out; not related to passage of gas. No loose stools, no blood in stool. Neg Cologuard 3/21. Long hx of constipation but sx improved. S/p episiotomy during childbirth and pt wonders if related. Doing kegels. Has occas SUI but kegels improved sx.   FH pancreatic cancer in her mom and colon cancer in her mat aunt. Pt has declined genetic testing in past; her sister did testing and pt doesn't want to know results.   Patient Active Problem List   Diagnosis Date Noted   Functional fecal incontinence 11/28/2021   Menorrhagia with regular cycle 08/28/2018   Routine general medical examination at a health care facility 02/27/2014   Screening for breast cancer 08/08/2012    Past Surgical History:  Procedure Laterality Date   VAGINAL DELIVERY     2   WISDOM TOOTH EXTRACTION      Family History  Problem Relation Age of Onset   Pancreatic cancer Mother 83   Hypertension Father    Colon cancer Maternal Aunt    Breast cancer Neg Hx     Social History   Socioeconomic History   Marital status: Married    Spouse name: Not on file   Number of children: Not on file   Years of education: Not on file   Highest education level: Not on file  Occupational History   Not on file  Tobacco Use   Smoking status: Never   Smokeless tobacco: Never  Vaping Use   Vaping Use: Never  used  Substance and Sexual Activity   Alcohol use: Yes   Drug use: No   Sexual activity: Yes    Birth control/protection: I.U.D.    Comment: Paragard  Other Topics Concern   Not on file  Social History Narrative   Married.   2 children.    Work - PT [employer]   Enjoys playing tennis, reading, walking, cooking.    Social Determinants of Health   Financial Resource Strain: Not on file  Food Insecurity: Not on file  Transportation Needs: Not on file  Physical Activity: Not on file  Stress: Not on file  Social Connections: Not on file  Intimate Partner Violence: Not on file    Outpatient Medications Prior to Visit  Medication Sig Dispense Refill   IUD's (PARAGARD INTRAUTERINE COPPER IU) 1 Device by Intrauterine route once.     psyllium (REGULOID) 0.52 G capsule Take 0.52 g by mouth daily.     No facility-administered medications prior to visit.      ROS:  Review of Systems  Constitutional:  Negative for fever.  Gastrointestinal:  Negative for blood in stool, constipation, diarrhea, nausea and vomiting.  Genitourinary:  Negative for dyspareunia, dysuria, flank pain, frequency, hematuria, urgency, vaginal bleeding, vaginal discharge and vaginal pain.  Musculoskeletal:  Negative for back pain.  Skin:  Negative for rash.  BREAST: No symptoms   OBJECTIVE:   Vitals:  BP 120/80   Ht 5\' 8"  (1.727 m)   Wt 150 lb (68 kg)   BMI 22.81 kg/m   Physical Exam Vitals reviewed.  Constitutional:      Appearance: She is well-developed.  Pulmonary:     Effort: Pulmonary effort is normal.  Genitourinary:    General: Normal vulva.     Pubic Area: No rash.      Labia:        Right: No rash, tenderness or lesion.        Left: No rash, tenderness or lesion.      Vagina: Normal. No vaginal discharge, erythema or tenderness.     Cervix: Normal.     Uterus: Normal. Enlarged. Not tender.      Adnexa: Right adnexa normal and left adnexa normal.       Right: No mass or  tenderness.         Left: No mass or tenderness.       Rectum: No mass or anal fissure. Abnormal anal tone.     Comments: NO CYSTOCELE/RECTOCELE ON EXAM; IUD STRINGS IN CX OS; MINIMAL ANAL TONE ON EXAM Musculoskeletal:        General: Normal range of motion.     Cervical back: Normal range of motion.  Skin:    General: Skin is warm and dry.  Neurological:     General: No focal deficit present.     Mental Status: She is alert and oriented to person, place, and time.  Psychiatric:        Mood and Affect: Mood normal.        Behavior: Behavior normal.        Thought Content: Thought content normal.        Judgment: Judgment normal.   IUD Removal Strings of IUD identified and grasped.  IUD removed without problem with ring forceps.  Pt tolerated this well.  IUD noted to be intact.   Assessment/Plan: Functional fecal incontinence--decreased rectal sphincter tone. No spinal cord injury/sx; no hx of MS/neuro disorder. Discussed GI ref vs pelvic PT. Pt declines for now. Will f/u prn.   Family history of pancreatic cancer--pt has declined cancer genetic testing in past/currently.   Perimenopause--f/u prn AUB. See if flow/duration improves with removal of IUD.   Encounter for IUD removal--tolerated well. Discussed new paragard if pt concerned about conception at her age. Husband may get vasectomy. F/u prn.      Return if symptoms worsen or fail to improve.  [name], PA-C 11/28/2021 3:11 PM

## 2021-12-26 ENCOUNTER — Other Ambulatory Visit: Payer: Self-pay | Admitting: Primary Care

## 2021-12-26 DIAGNOSIS — Z1231 Encounter for screening mammogram for malignant neoplasm of breast: Secondary | ICD-10-CM

## 2022-01-30 ENCOUNTER — Ambulatory Visit
Admission: RE | Admit: 2022-01-30 | Discharge: 2022-01-30 | Disposition: A | Discharge status: Home / Self Care | Payer: BC Managed Care – PPO | Source: Ambulatory Visit | Attending: Primary Care | Admitting: Primary Care

## 2022-01-30 ENCOUNTER — Other Ambulatory Visit: Payer: Self-pay

## 2022-01-30 DIAGNOSIS — Z1231 Encounter for screening mammogram for malignant neoplasm of breast: Secondary | ICD-10-CM | POA: Insufficient documentation

## 2022-03-29 DIAGNOSIS — D2261 Melanocytic nevi of right upper limb, including shoulder: Secondary | ICD-10-CM | POA: Diagnosis not present

## 2022-03-29 DIAGNOSIS — D2262 Melanocytic nevi of left upper limb, including shoulder: Secondary | ICD-10-CM | POA: Diagnosis not present

## 2022-03-29 DIAGNOSIS — D2272 Melanocytic nevi of left lower limb, including hip: Secondary | ICD-10-CM | POA: Diagnosis not present

## 2022-03-29 DIAGNOSIS — D225 Melanocytic nevi of trunk: Secondary | ICD-10-CM | POA: Diagnosis not present

## 2022-06-19 ENCOUNTER — Encounter: Payer: BC Managed Care – PPO | Admitting: Primary Care

## 2022-06-28 ENCOUNTER — Encounter: Payer: BC Managed Care – PPO | Admitting: Primary Care

## 2022-09-06 ENCOUNTER — Telehealth: Payer: Self-pay

## 2022-09-06 NOTE — Telephone Encounter (Signed)
Pt called to ask about purpura she has just noticed on abd and groin; pt is not sure how long she has had the little purple spots. Advised can have this for different reasons and pt said her father has brain CA and so she will call later for appt. Then pt said she has had dizziness where the room spins,H/A on and off, pt not sure when last had these symptoms, maybe a week. Pt had mid sternal chest pain that was dull that comes and goes; last time had was this morning.pt thinks could be indigestion. Pt is  under a lot of stress; No SOB or vision changes. Pt not having any symptoms right now.Pt has not checked BP recently. Pt scheduled appt with Dr Diona Browner on 09/07/22 at 11 AM with UC & ED precautions and pt voiced understanding and appreciative.sending note to DR Diona Browner and Anastasiya CMA.

## 2022-09-07 ENCOUNTER — Other Ambulatory Visit (INDEPENDENT_AMBULATORY_CARE_PROVIDER_SITE_OTHER): Payer: BC Managed Care – PPO

## 2022-09-07 ENCOUNTER — Ambulatory Visit: Payer: BC Managed Care – PPO | Admitting: Family Medicine

## 2022-09-07 VITALS — BP 138/82 | HR 66 | Temp 96.6°F | Resp 14 | Ht 68.0 in | Wt 145.4 lb

## 2022-09-07 DIAGNOSIS — R590 Localized enlarged lymph nodes: Secondary | ICD-10-CM

## 2022-09-07 DIAGNOSIS — D1801 Hemangioma of skin and subcutaneous tissue: Secondary | ICD-10-CM | POA: Diagnosis not present

## 2022-09-07 DIAGNOSIS — Z13220 Encounter for screening for lipoid disorders: Secondary | ICD-10-CM | POA: Diagnosis not present

## 2022-09-07 DIAGNOSIS — Z636 Dependent relative needing care at home: Secondary | ICD-10-CM | POA: Diagnosis not present

## 2022-09-07 DIAGNOSIS — Z131 Encounter for screening for diabetes mellitus: Secondary | ICD-10-CM

## 2022-09-07 DIAGNOSIS — R42 Dizziness and giddiness: Secondary | ICD-10-CM | POA: Diagnosis not present

## 2022-09-07 DIAGNOSIS — Z1321 Encounter for screening for nutritional disorder: Secondary | ICD-10-CM | POA: Diagnosis not present

## 2022-09-07 DIAGNOSIS — F411 Generalized anxiety disorder: Secondary | ICD-10-CM | POA: Insufficient documentation

## 2022-09-07 HISTORY — DX: Dependent relative needing care at home: Z63.6

## 2022-09-07 HISTORY — DX: Dizziness and giddiness: R42

## 2022-09-07 LAB — LIPID PANEL
Cholesterol: 256 mg/dL — ABNORMAL HIGH (ref 0–200)
HDL: 72.6 mg/dL (ref >39.00)
LDL Cholesterol: 169 mg/dL — ABNORMAL HIGH (ref 0–99)
NonHDL: 182.91
Total CHOL/HDL Ratio: 4
Triglycerides: 69 mg/dL (ref 0.0–149.0)
VLDL: 13.8 mg/dL (ref 0.0–40.0)

## 2022-09-07 LAB — COMPREHENSIVE METABOLIC PANEL
ALT: 8 U/L (ref 0–35)
AST: 15 U/L (ref 0–37)
Albumin: 4.4 g/dL (ref 3.5–5.2)
Alkaline Phosphatase: 40 U/L (ref 39–117)
BUN: 11 mg/dL (ref 6–23)
CO2: 27 mEq/L (ref 19–32)
Calcium: 9.4 mg/dL (ref 8.4–10.5)
Chloride: 102 mEq/L (ref 96–112)
Creatinine, Ser: 0.74 mg/dL (ref 0.40–1.20)
GFR: 91.81 mL/min (ref >60.00)
Glucose, Bld: 71 mg/dL (ref 70–99)
Potassium: 4.3 mEq/L (ref 3.5–5.1)
Sodium: 137 mEq/L (ref 135–145)
Total Bilirubin: 0.7 mg/dL (ref 0.2–1.2)
Total Protein: 7.4 g/dL (ref 6.0–8.3)

## 2022-09-07 LAB — CBC WITH DIFFERENTIAL/PLATELET
Basophils Absolute: 0 10*3/uL (ref 0.0–0.1)
Basophils Relative: 0.6 % (ref 0.0–3.0)
Eosinophils Absolute: 0 10*3/uL (ref 0.0–0.7)
Eosinophils Relative: 0.2 % (ref 0.0–5.0)
HCT: 41 % (ref 36.0–46.0)
Hemoglobin: 13.8 g/dL (ref 12.0–15.0)
Lymphocytes Relative: 34.8 % (ref 12.0–46.0)
Lymphs Abs: 2.2 10*3/uL (ref 0.7–4.0)
MCHC: 33.7 g/dL (ref 30.0–36.0)
MCV: 96.2 fl (ref 78.0–100.0)
Monocytes Absolute: 0.3 10*3/uL (ref 0.1–1.0)
Monocytes Relative: 4.5 % (ref 3.0–12.0)
Neutro Abs: 3.7 10*3/uL (ref 1.4–7.7)
Neutrophils Relative %: 59.9 % (ref 43.0–77.0)
Platelets: 200 10*3/uL (ref 150.0–400.0)
RBC: 4.26 Mil/uL (ref 3.87–5.11)
RDW: 12.6 % (ref 11.5–15.5)
WBC: 6.2 10*3/uL (ref 4.0–10.5)

## 2022-09-07 NOTE — Progress Notes (Signed)
Patient ID: Latasha Cooper, female    DOB: 1966-06-18, 56 y.o.   MRN: [id number]  This visit was conducted in person.  BP 138/82   Pulse 66   Temp (!) 96.6 F (35.9 C) (Temporal)   Resp 14   Ht '5\' 8"'$  (1.727 m)   Wt 145 lb 6 oz (65.9 kg)   LMP 08/09/2022   SpO2 97%   BMI 22.10 kg/m    CC:  Chief Complaint  Patient presents with   Spots/rash    In the abdomen and upper leg area, noticed red spots.    Dizziness    Started not feeling well over labor day weekend but got better as weeks went by, has had a lot of stress, has not had any more dizziness in the last few days. Yesterday felt like maybe had a panic attack    Subjective:   HPI: Latasha Cooper is a 56 y.o. female presenting on 09/07/2022 for Spots/rash (In the abdomen and upper leg area, noticed red spots. ) and Dizziness (Started not feeling well over labor day weekend but got better as weeks went by, has had a lot of stress, has not had any more dizziness in the last few days. Yesterday felt like maybe had a panic attack)    She has been trying to get in to see PCP for a while.  She has been under more stress lately.Marland Kitchen other passed in 2007 to pancreatic cancer... had GAD, panic disorders.  Symptoms had improved over the years but now seems to be back.  She has been seeing counselor 1-2 times a month.  Recently father dx with glioblastoma.  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   She has noted red lesion on abdomen and upper legs. Noticed them recently.Marland Kitchen asymptomatic.. no itching, no pain.  She thinks she is just anxious about the lesions... " could it be purpura? Am I dying" said with a laugh.    Perimenopausal symptoms.  She has noted some dizziness off and on, with changes of position, for several weeks. Has gradually improved.   GAD7: 1 MDD: 0   Relevant past medical, surgical, family and social history reviewed and updated as indicated. Interim medical history since our last visit reviewed. Allergies  and medications reviewed and updated. Outpatient Medications Prior to Visit  Medication Sig Dispense Refill   psyllium (REGULOID) 0.52 G capsule Take 0.52 g by mouth daily.     IUD's (PARAGARD INTRAUTERINE COPPER IU) 1 Device by Intrauterine route once.     No facility-administered medications prior to visit.     Per HPI unless specifically indicated in ROS section below Review of Systems  Constitutional:  Negative for fatigue and fever.  HENT:  Negative for congestion.   Eyes:  Negative for pain.  Respiratory:  Negative for cough and shortness of breath.   Cardiovascular:  Negative for chest pain, palpitations and leg swelling.  Gastrointestinal:  Negative for abdominal pain.  Genitourinary:  Negative for dysuria and vaginal bleeding.  Musculoskeletal:  Negative for back pain.  Neurological:  Negative for syncope, light-headedness and headaches.  Psychiatric/Behavioral:  Negative for dysphoric mood. The patient is nervous/anxious.    Objective:  BP 138/82   Pulse 66   Temp (!) 96.6 F (35.9 C) (Temporal)   Resp 14   Ht '5\' 8"'$  (1.727 m)   Wt 145 lb 6 oz (65.9 kg)   LMP 08/09/2022   SpO2 97%   BMI 22.10  kg/m   Wt Readings from Last 3 Encounters:  09/07/22 145 lb 6 oz (65.9 kg)  11/28/21 150 lb (68 kg)  05/24/21 145 lb (65.8 kg)      Physical Exam Constitutional:      General: She is not in acute distress.    Appearance: Normal appearance. She is well-developed. She is not ill-appearing or toxic-appearing.  HENT:     Head: Normocephalic.     Right Ear: Hearing, tympanic membrane, ear canal and external ear normal. Tympanic membrane is not erythematous, retracted or bulging.     Left Ear: Hearing, tympanic membrane, ear canal and external ear normal. Tympanic membrane is not erythematous, retracted or bulging.     Nose: No mucosal edema or rhinorrhea.     Right Sinus: No maxillary sinus tenderness or frontal sinus tenderness.     Left Sinus: No maxillary sinus tenderness  or frontal sinus tenderness.     Mouth/Throat:     Pharynx: Uvula midline.  Eyes:     General: Lids are normal. Lids are everted, no foreign bodies appreciated.     Conjunctiva/sclera: Conjunctivae normal.     Pupils: Pupils are equal, round, and reactive to light.  Neck:     Thyroid: No thyroid mass or thyromegaly.     Vascular: No carotid bruit.     Trachea: Trachea normal.      Comments: 1 swollen lymph node on left neck in anterior cervical chain, per patient unchanged and has been present long-term.  Nontender no clear associated infection. Cardiovascular:     Rate and Rhythm: Normal rate and regular rhythm.     Pulses: Normal pulses.     Heart sounds: Normal heart sounds, S1 normal and S2 normal. No murmur heard.    No friction rub. No gallop.  Pulmonary:     Effort: Pulmonary effort is normal. No tachypnea or respiratory distress.     Breath sounds: Normal breath sounds. No decreased breath sounds, wheezing, rhonchi or rales.  Abdominal:     General: Bowel sounds are normal.     Palpations: Abdomen is soft.     Tenderness: There is no abdominal tenderness.  Musculoskeletal:     Cervical back: Normal range of motion and neck supple.  Lymphadenopathy:     Cervical: Cervical adenopathy present.     Right cervical: No superficial, deep or posterior cervical adenopathy.    Left cervical: Superficial cervical adenopathy present. No deep or posterior cervical adenopathy.  Skin:    General: Skin is warm and dry.     Findings: No rash.  Neurological:     Mental Status: She is alert and oriented to person, place, and time.     GCS: GCS eye subscore is 4. GCS verbal subscore is 5. GCS motor subscore is 6.     Cranial Nerves: No cranial nerve deficit.     Sensory: No sensory deficit.     Motor: No abnormal muscle tone.     Coordination: Coordination normal.     Gait: Gait normal.     Deep Tendon Reflexes: Reflexes are normal and symmetric.     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        Speech: Speech normal.        Behavior: Behavior normal. Behavior is cooperative.  Thought Content: Thought content normal.        Cognition and Memory: Memory is not impaired. She does not exhibit impaired recent memory or impaired remote memory.        Judgment: Judgment normal.       Results for orders placed or performed in visit on 04/06/21  POCT urine pregnancy  Result Value Ref Range   Preg Test, Ur Negative Negative  POCT Urinalysis Dipstick  Result Value Ref Range   Color, UA yellow    Clarity, UA clear    Glucose, UA Negative Negative   Bilirubin, UA neg    Ketones, UA neg    Spec Grav, UA 1.015 1.010 - 1.025   Blood, UA mod    pH, UA 7.5 5.0 - 8.0   Protein, UA Negative Negative   Urobilinogen, UA     Nitrite, UA neg    Leukocytes, UA Negative Negative   Appearance     Odor    Cytology - PAP  Result Value Ref Range   High risk HPV Negative    Adequacy      Satisfactory for evaluation; transformation zone component PRESENT.   Diagnosis      - Negative for intraepithelial lesion or malignancy (NILM)   Comment Normal Reference Range HPV - Negative      COVID 19 screen:  No recent travel or known exposure to COVID19 The patient denies respiratory symptoms of COVID 19 at this time. The importance of social distancing was discussed today.   Assessment and Plan    Problem List Items Addressed This Visit     Caregiver stress    Acute, moderate control.  She is continue to work on behavioral changes to control anxiety and stress.  I do think some of her worry about current symptoms could be associated with generalized anxiety.  Continue seeing a Social worker.  We discussed possibility of considering treatment with sertraline but she is not interested at this time. Of note her GAD 7 score is very low  and does not clearly qualify her for a diagnosis of generalized anxiety.      Cervical lymphadenopathy    Per patient this has been present long-term.  There is no other lymphadenopathy in her neck, axilla supraclavicular or femoral We will check CBC.      Cherry hemangioma    Patient reassured, normal aging skin changes.      Dizziness - Primary    Most likely secondary to stress, possible mild dehydration.  Will evaluate for secondary causes with labs.      Relevant Orders   CBC with Differential/Platelet   TSH   Vitamin B12   Comprehensive metabolic panel   Other Visit Diagnoses     Screening cholesterol level       Relevant Orders   Lipid panel   Screening for diabetes mellitus       Relevant Orders   Hemoglobin A1c   Encounter for vitamin deficiency screening       Relevant Orders   VITAMIN D 25 Hydroxy (Vit-D Deficiency, Fractures)        Eliezer Lofts, MD

## 2022-09-07 NOTE — Patient Instructions (Addendum)
Rest, make sure to keep up with healthy lifestyle, adequate sleep. Continue counseling  Please stop at the lab to have labs drawn.

## 2022-09-07 NOTE — Assessment & Plan Note (Signed)
Acute, moderate control.  She is continue to work on behavioral changes to control anxiety and stress.  I do think some of her worry about current symptoms could be associated with generalized anxiety.  Continue seeing a Social worker.  We discussed possibility of considering treatment with sertraline but she is not interested at this time. Of note her GAD 7 score is very low and does not clearly qualify her for a diagnosis of generalized anxiety.

## 2022-09-07 NOTE — Assessment & Plan Note (Signed)
Patient reassured, normal aging skin changes.

## 2022-09-07 NOTE — Assessment & Plan Note (Signed)
Per patient this has been present long-term.  There is no other lymphadenopathy in her neck, axilla supraclavicular or femoral We will check CBC.

## 2022-09-07 NOTE — Assessment & Plan Note (Signed)
Most likely secondary to stress, possible mild dehydration.  Will evaluate for secondary causes with labs.

## 2022-09-08 LAB — VITAMIN D 25 HYDROXY (VIT D DEFICIENCY, FRACTURES): VITD: 31.43 ng/mL (ref 30.00–100.00)

## 2022-09-08 LAB — VITAMIN B12: Vitamin B-12: 282 pg/mL (ref 211–911)

## 2022-09-08 LAB — TSH: TSH: 3.1 u[IU]/mL (ref 0.35–5.50)

## 2022-09-11 LAB — HEMOGLOBIN A1C: Hgb A1c MFr Bld: 5.6 % (ref 4.6–6.5)

## 2022-10-27 DIAGNOSIS — Z23 Encounter for immunization: Secondary | ICD-10-CM | POA: Diagnosis not present

## 2022-12-07 ENCOUNTER — Encounter: Payer: BC Managed Care – PPO | Admitting: Primary Care

## 2023-01-17 ENCOUNTER — Other Ambulatory Visit: Payer: Self-pay | Admitting: Primary Care

## 2023-01-17 DIAGNOSIS — Z1231 Encounter for screening mammogram for malignant neoplasm of breast: Secondary | ICD-10-CM

## 2023-02-23 ENCOUNTER — Encounter: Payer: BC Managed Care – PPO | Admitting: Primary Care

## 2023-03-09 ENCOUNTER — Ambulatory Visit
Admission: RE | Admit: 2023-03-09 | Discharge: 2023-03-09 | Disposition: A | Discharge status: Home / Self Care | Payer: BC Managed Care – PPO | Source: Ambulatory Visit | Attending: Primary Care | Admitting: Primary Care

## 2023-03-09 ENCOUNTER — Encounter: Payer: Self-pay | Admitting: Primary Care

## 2023-03-09 ENCOUNTER — Ambulatory Visit (INDEPENDENT_AMBULATORY_CARE_PROVIDER_SITE_OTHER): Payer: BC Managed Care – PPO | Admitting: Primary Care

## 2023-03-09 VITALS — BP 136/82 | HR 65 | Temp 98.0°F | Ht 68.0 in | Wt 147.0 lb

## 2023-03-09 DIAGNOSIS — Z1231 Encounter for screening mammogram for malignant neoplasm of breast: Secondary | ICD-10-CM | POA: Insufficient documentation

## 2023-03-09 DIAGNOSIS — H6121 Impacted cerumen, right ear: Secondary | ICD-10-CM | POA: Diagnosis not present

## 2023-03-09 DIAGNOSIS — E785 Hyperlipidemia, unspecified: Secondary | ICD-10-CM

## 2023-03-09 DIAGNOSIS — Z1211 Encounter for screening for malignant neoplasm of colon: Secondary | ICD-10-CM

## 2023-03-09 DIAGNOSIS — Z0000 Encounter for general adult medical examination without abnormal findings: Secondary | ICD-10-CM | POA: Diagnosis not present

## 2023-03-09 DIAGNOSIS — H6120 Impacted cerumen, unspecified ear: Secondary | ICD-10-CM | POA: Insufficient documentation

## 2023-03-09 DIAGNOSIS — F411 Generalized anxiety disorder: Secondary | ICD-10-CM | POA: Diagnosis not present

## 2023-03-09 NOTE — Assessment & Plan Note (Signed)
Controlled. No concerns today. 

## 2023-03-09 NOTE — Progress Notes (Signed)
Subjective:    Patient ID: Latasha Cooper, female    DOB: 1966/06/27, 57 y.o.   MRN: [id number]  HPI  Latasha Cooper is a very pleasant 57 y.o. female who presents today for complete physical and follow up of chronic conditions.  She has not seen me since February 2021.  She would also like to discuss right ear fullness. Acute for the last 1 month, believes it to be a cerumen impaction. She does use q-tips regularly. She has a history of cerumen impaction.   Immunizations: -Tetanus: Completed years ago, declines today. -Influenza: Completed this season -Shingles: Completed Shingrix series  Diet: Fair diet.  Exercise: No regular exercise.  Eye exam: Completed several years ago. Scheduled in 2 weeks. Dental exam: Completes semi-annually    Pap Smear: April 2022 Mammogram: Completed today Colonoscopy: Due. Opts for Cologuard.   BP Readings from Last 3 Encounters:  03/09/23 136/82  09/07/22 138/82  11/28/21 120/80      Review of Systems  Constitutional:  Negative for unexpected weight change.  HENT:  Negative for rhinorrhea.        Ear fullness  Respiratory:  Negative for cough and shortness of breath.   Cardiovascular:  Negative for chest pain.  Gastrointestinal:  Negative for constipation and diarrhea.  Genitourinary:  Negative for difficulty urinating.  Musculoskeletal:  Negative for arthralgias and myalgias.  Skin:  Negative for rash.  Allergic/Immunologic: Negative for environmental allergies.  Neurological:  Negative for dizziness and headaches.  Psychiatric/Behavioral:  The patient is not nervous/anxious.          Past Medical History:  Diagnosis Date   Caregiver stress 09/07/2022   Chicken pox    Dizziness 09/07/2022   IUD 10/162012   Dr. Rayford Halsted   UTI (lower urinary tract infection)     Social History   Socioeconomic History   Marital status: Married    Spouse name: Not on file   Number of children: Not on file   Years of education: Not on  file   Highest education level: Not on file  Occupational History   Not on file  Tobacco Use   Smoking status: Never   Smokeless tobacco: Never  Vaping Use   Vaping Use: Never used  Substance and Sexual Activity   Alcohol use: Yes   Drug use: No   Sexual activity: Yes    Birth control/protection: I.U.D.    Comment: Paragard  Other Topics Concern   Not on file  Social History Narrative   Married.   2 children.    Work - [employer]   Enjoys playing tennis, reading, walking, cooking.    Social Determinants of Health   Financial Resource Strain: Not on file  Food Insecurity: Not on file  Transportation Needs: Not on file  Physical Activity: Not on file  Stress: Not on file  Social Connections: Not on file  Intimate Partner Violence: Not on file    Past Surgical History:  Procedure Laterality Date   VAGINAL DELIVERY     2   WISDOM TOOTH EXTRACTION      Family History  Problem Relation Age of Onset   Pancreatic cancer Mother 45   Hypertension Father    Colon cancer Maternal Aunt    Breast cancer Neg Hx     No Known Allergies  Current Outpatient Medications on File Prior to Visit  Medication Sig Dispense Refill   psyllium (REGULOID) 0.52 G capsule Take 0.52 g by mouth daily.  No current facility-administered medications on file prior to visit.    BP 136/82   Pulse 65   Temp 98 F (36.7 C) (Temporal)   Ht 5\' 8"  (1.727 m)   Wt 147 lb (66.7 kg)   LMP 02/25/2023 (Exact Date)   SpO2 97%   BMI 22.35 kg/m  Objective:   Physical Exam HENT:     Right Ear: Tympanic membrane and ear canal normal. There is impacted cerumen.     Left Ear: Tympanic membrane and ear canal normal.     Nose: Nose normal.  Eyes:     Conjunctiva/sclera: Conjunctivae normal.     Pupils: Pupils are equal, round, and reactive to light.  Neck:     Thyroid: No thyromegaly.  Cardiovascular:     Rate and Rhythm: Normal rate and regular rhythm.     Heart sounds: No murmur  heard. Pulmonary:     Effort: Pulmonary effort is normal.     Breath sounds: Normal breath sounds. No rales.  Abdominal:     General: Bowel sounds are normal.     Palpations: Abdomen is soft.     Tenderness: There is no abdominal tenderness.  Musculoskeletal:        General: Normal range of motion.     Cervical back: Neck supple.  Lymphadenopathy:     Cervical: No cervical adenopathy.  Skin:    General: Skin is warm and dry.     Findings: No rash.  Neurological:     Mental Status: She is alert and oriented to person, place, and time.     Cranial Nerves: No cranial nerve deficit.     Deep Tendon Reflexes: Reflexes are normal and symmetric.  Psychiatric:        Mood and Affect: Mood normal.           Assessment & Plan:  Routine general medical examination at a health care facility Assessment & Plan: Declines Tetanus vaccine Pap smear UTD. Mammogram completed today Colonoscopy due, she declines but opts for Cologuard.  Discussed the importance of a healthy diet and regular exercise in order for weight loss, and to reduce the risk of further co-morbidity.  Exam stable. Labs pending.  Follow up in 1 year for repeat physical.    Hyperlipidemia, unspecified hyperlipidemia type -     Lipid panel; Future -     Hemoglobin A1c; Future -     Lipoprotein A (LPA); Future  Screening for colon cancer -     Cologuard  GAD (generalized anxiety disorder) Assessment & Plan: Controlled. No concerns today.   Impacted cerumen of right ear Assessment & Plan: Right cerumen impaction identified on exam. Patient consented to irrigation of canals bilaterally.  Right canals irrigated. Patient tolerated well. TM's and canals post irrigation unremarkable.   Discussed home care instructions.           [name], NP

## 2023-03-09 NOTE — Patient Instructions (Signed)
Schedule a lab appointment to return for fasting labs.  Complete the Cologuard Kit once received.  It was a pleasure to see you today!

## 2023-03-09 NOTE — Assessment & Plan Note (Signed)
Right cerumen impaction identified on exam. Patient consented to irrigation of canals bilaterally.  Right canals irrigated. Patient tolerated well. TM's and canals post irrigation unremarkable.   Discussed home care instructions.

## 2023-03-09 NOTE — Assessment & Plan Note (Signed)
Declines Tetanus vaccine Pap smear UTD. Mammogram completed today Colonoscopy due, she declines but opts for Cologuard.  Discussed the importance of a healthy diet and regular exercise in order for weight loss, and to reduce the risk of further co-morbidity.  Exam stable. Labs pending.  Follow up in 1 year for repeat physical.

## 2023-03-22 DIAGNOSIS — Z1211 Encounter for screening for malignant neoplasm of colon: Secondary | ICD-10-CM | POA: Diagnosis not present

## 2023-03-28 LAB — COLOGUARD: COLOGUARD: NEGATIVE

## 2023-03-30 DIAGNOSIS — D225 Melanocytic nevi of trunk: Secondary | ICD-10-CM | POA: Diagnosis not present

## 2023-03-30 DIAGNOSIS — B353 Tinea pedis: Secondary | ICD-10-CM | POA: Diagnosis not present

## 2023-03-30 DIAGNOSIS — L720 Epidermal cyst: Secondary | ICD-10-CM | POA: Diagnosis not present

## 2023-03-30 DIAGNOSIS — D2262 Melanocytic nevi of left upper limb, including shoulder: Secondary | ICD-10-CM | POA: Diagnosis not present

## 2024-01-01 ENCOUNTER — Encounter: Payer: Self-pay | Admitting: Family Medicine

## 2024-01-01 ENCOUNTER — Ambulatory Visit: Payer: BC Managed Care – PPO | Admitting: Family Medicine

## 2024-01-01 VITALS — BP 120/80 | HR 73 | Temp 97.4°F | Ht 68.0 in | Wt 149.4 lb

## 2024-01-01 DIAGNOSIS — H6121 Impacted cerumen, right ear: Secondary | ICD-10-CM | POA: Diagnosis not present

## 2024-01-01 NOTE — Progress Notes (Addendum)
    Patient ID: Latasha Cooper, female    DOB: [birth year], 58 y.o.   MRN: [id number]  This visit was conducted in person.  BP 120/80 (BP Location: Left Arm, Patient Position: Sitting, Cuff Size: Normal)   Pulse 73   Temp (!) 97.4 F (36.3 C) (Temporal)   Ht 5' 8 (1.727 m)   Wt 149 lb 6 oz (67.8 kg)   SpO2 99%   BMI 22.71 kg/m    CC:  Chief Complaint  Patient presents with   Ear feels clogged    Subjective:   HPI: Latasha Cooper is a 58 y.o. female presenting on 01/01/2024 for Ear feels clogged   New onset clogged feeling in right ear.  Has had wax impaction in past.      Relevant past medical, surgical, family and social history reviewed and updated as indicated. Interim medical history since our last visit reviewed. Allergies and medications reviewed and updated. Outpatient Medications Prior to Visit  Medication Sig Dispense Refill   psyllium (REGULOID) 0.52 G capsule Take 0.52 g by mouth daily.     No facility-administered medications prior to visit.     Per HPI unless specifically indicated in ROS section below Review of Systems  Constitutional:  Negative for fatigue and fever.  HENT:  Negative for congestion.   Eyes:  Negative for pain.  Respiratory:  Negative for cough and shortness of breath.   Cardiovascular:  Negative for chest pain, palpitations and leg swelling.  Gastrointestinal:  Negative for abdominal pain.  Genitourinary:  Negative for dysuria and vaginal bleeding.  Musculoskeletal:  Negative for back pain.  Neurological:  Negative for syncope, light-headedness and headaches.  Psychiatric/Behavioral:  Negative for dysphoric mood.    Objective:  BP 120/80 (BP Location: Left Arm, Patient Position: Sitting, Cuff Size: Normal)   Pulse 73   Temp (!) 97.4 F (36.3 C) (Temporal)   Ht 5' 8 (1.727 m)   Wt 149 lb 6 oz (67.8 kg)   SpO2 99%   BMI 22.71 kg/m   Wt Readings from Last 3 Encounters:  01/01/24 149 lb 6 oz (67.8 kg)  03/09/23 147 lb (66.7 kg)   09/07/22 145 lb 6 oz (65.9 kg)      Physical Exam HENT:     Head: Normocephalic.     Right Ear: There is impacted cerumen.     Left Ear: Tympanic membrane normal.     Ears:     Comments:  Right TM clear of wax after irrigation Neurological:     Mental Status: She is alert.       Results for orders placed or performed in visit on 03/09/23  Cologuard   Collection Time: 03/22/23  7:37 AM  Result Value Ref Range   COLOGUARD Negative Negative    Assessment and Plan  Impacted cerumen of right ear  Cerumen impaction removal via irrigation Performed by : SABRA Arland Morel  Consent for procedure obtained verbally. Right  ear lavaged/ irrigated with warm water gently. Pt tolerated procedure well, with no complications. After procedure B ear clear of cerumen impaction, with minimal ear canal irritation and redness, no bleeding. TM intact and symptoms improved.   No follow-ups on file.   Greig Ring, MD

## 2024-04-08 ENCOUNTER — Other Ambulatory Visit: Payer: Self-pay | Admitting: Primary Care

## 2024-04-08 DIAGNOSIS — Z1231 Encounter for screening mammogram for malignant neoplasm of breast: Secondary | ICD-10-CM

## 2024-04-14 DIAGNOSIS — D2272 Melanocytic nevi of left lower limb, including hip: Secondary | ICD-10-CM | POA: Diagnosis not present

## 2024-04-14 DIAGNOSIS — D2261 Melanocytic nevi of right upper limb, including shoulder: Secondary | ICD-10-CM | POA: Diagnosis not present

## 2024-04-14 DIAGNOSIS — D2262 Melanocytic nevi of left upper limb, including shoulder: Secondary | ICD-10-CM | POA: Diagnosis not present

## 2024-04-14 DIAGNOSIS — D225 Melanocytic nevi of trunk: Secondary | ICD-10-CM | POA: Diagnosis not present

## 2024-04-18 ENCOUNTER — Ambulatory Visit (INDEPENDENT_AMBULATORY_CARE_PROVIDER_SITE_OTHER): Admitting: Primary Care

## 2024-04-18 ENCOUNTER — Encounter: Payer: Self-pay | Admitting: Primary Care

## 2024-04-18 ENCOUNTER — Ambulatory Visit
Admission: RE | Admit: 2024-04-18 | Discharge: 2024-04-18 | Disposition: A | Discharge status: Home / Self Care | Source: Ambulatory Visit | Attending: Primary Care | Admitting: Primary Care

## 2024-04-18 ENCOUNTER — Other Ambulatory Visit (HOSPITAL_COMMUNITY)
Admission: RE | Admit: 2024-04-18 | Discharge: 2024-04-18 | Disposition: A | Discharge status: Home / Self Care | Source: Ambulatory Visit | Attending: Primary Care | Admitting: Primary Care

## 2024-04-18 VITALS — BP 118/64 | HR 62 | Temp 97.3°F | Ht 68.0 in | Wt 147.0 lb

## 2024-04-18 DIAGNOSIS — Z124 Encounter for screening for malignant neoplasm of cervix: Secondary | ICD-10-CM | POA: Insufficient documentation

## 2024-04-18 DIAGNOSIS — Z0001 Encounter for general adult medical examination with abnormal findings: Secondary | ICD-10-CM | POA: Diagnosis not present

## 2024-04-18 DIAGNOSIS — N952 Postmenopausal atrophic vaginitis: Secondary | ICD-10-CM | POA: Diagnosis not present

## 2024-04-18 DIAGNOSIS — E785 Hyperlipidemia, unspecified: Secondary | ICD-10-CM | POA: Diagnosis not present

## 2024-04-18 DIAGNOSIS — N951 Menopausal and female climacteric states: Secondary | ICD-10-CM

## 2024-04-18 DIAGNOSIS — Z1231 Encounter for screening mammogram for malignant neoplasm of breast: Secondary | ICD-10-CM | POA: Insufficient documentation

## 2024-04-18 MED ORDER — ESTRADIOL 0.1 MG/GM VA CREA: TOPICAL_CREAM | VAGINAL | 2 refills | Status: AC

## 2024-04-18 NOTE — Assessment & Plan Note (Addendum)
 Irregularity of menses and symptoms suggestive. Pap smear completed today.  Prescription for Estrace cream provided to use 2-3 times weekly for vaginal dryness/atrophy. She will update if no improvement.

## 2024-04-18 NOTE — Assessment & Plan Note (Signed)
 Repeat lipid panel pending. Plans to return when fasting.

## 2024-04-18 NOTE — Patient Instructions (Signed)
 You can apply the Estrace cream 2-3 times weekly for vaginal dryness.  Schedule a lab only appointment to complete your lab work if preferred.  It was a pleasure to see you today!

## 2024-04-18 NOTE — Assessment & Plan Note (Addendum)
 Tetanus due. Pap smear completed today. Mammogram completed today, results pending. Colon cancer screening up-to-date, due 2027  Discussed the importance of a healthy diet and regular exercise in order for weight loss, and to reduce the risk of further co-morbidity.  Exam stable. Labs declined today, she will return when fasting.  Follow up in 1 year for repeat physical.

## 2024-04-18 NOTE — Progress Notes (Signed)
 Subjective:    Patient ID: Latasha Cooper, female    DOB: 1966-11-04, 58 y.o.   MRN: [id number]  HPI  Latasha Cooper is a very pleasant 58 y.o. female who presents today for complete physical and follow up of chronic conditions.  She would also like to discuss symptoms of menopause. History of dysfunctional uterine bleeding, underwent pelvic and transvaginal ultrasound in 2019 which showed multiple suspected fibroids, otherwise unremarkable. Last menstrual period was in March 2025, she's had irregular menstrual cycles since Fall 2024. She's also noticed bleeding after intercourse a few times. She experienced hot flashes 10 years ago, denies hot flashes now. Also with mood swings recently, chronic vaginal dryness. She is interested in estrace vaginal cream.   Immunizations: -Tetanus: Completed years ago, declines  -Shingles: Completed Shingrix series  Diet: Fair diet.  Exercise: Regular exercise.  Eye exam: Completed 1 year ago.  Dental exam: Completes semi-annually    Pap Smear: Completed in April 2022 Mammogram: Completed Today  Colonoscopy: Completed Cologuard in 2024, negative      Review of Systems  Constitutional:  Negative for unexpected weight change.  HENT:  Negative for rhinorrhea.   Respiratory:  Negative for cough and shortness of breath.   Cardiovascular:  Negative for chest pain.  Gastrointestinal:  Negative for constipation and diarrhea.  Genitourinary:  Positive for menstrual problem. Negative for difficulty urinating.       Vaginal dryness  Musculoskeletal:  Negative for arthralgias and myalgias.  Skin:  Negative for rash.  Allergic/Immunologic: Negative for environmental allergies.  Neurological:  Negative for dizziness and headaches.  Psychiatric/Behavioral:  The patient is not nervous/anxious.          Past Medical History:  Diagnosis Date   Caregiver stress 09/07/2022   Chicken pox    Dizziness 09/07/2022   IUD 10/162012   Dr. Annye Basque   UTI  (lower urinary tract infection)     Social History   Socioeconomic History   Marital status: Married    Spouse name: Not on file   Number of children: Not on file   Years of education: Not on file   Highest education level: Doctorate  Occupational History   Not on file  Tobacco Use   Smoking status: Never   Smokeless tobacco: Never  Vaping Use   Vaping status: Never Used  Substance and Sexual Activity   Alcohol use: Yes   Drug use: No   Sexual activity: Yes    Birth control/protection: I.U.D.    Comment: Paragard  Other Topics Concern   Not on file  Social History Narrative   Married.   2 children.    Work - [employer] for [employer]   Enjoys playing tennis, reading, walking, cooking.    Social Drivers of Corporate investment banker Strain: Low Risk  (04/18/2024)   Overall Financial Resource Strain (CARDIA)    Difficulty of Paying Living Expenses: Not hard at all  Food Insecurity: No Food Insecurity (04/18/2024)   Hunger Vital Sign    Worried About Running Out of Food in the Last Year: Never true    Ran Out of Food in the Last Year: Never true  Transportation Needs: No Transportation Needs (04/18/2024)   PRAPARE - Administrator, Civil Service (Medical): No    Lack of Transportation (Non-Medical): No  Physical Activity: Sufficiently Active (04/18/2024)   Exercise Vital Sign    Days of Exercise per Week: 6 days    Minutes of Exercise per  Session: 30 min  Stress: No Stress Concern Present (04/18/2024)   [employer] of Occupational Health - Occupational Stress Questionnaire    Feeling of Stress : Not at all  Social Connections: Unknown (04/18/2024)   Social Connection and Isolation Panel [NHANES]    Frequency of Communication with Friends and Family: Three times a week    Frequency of Social Gatherings with Friends and Family: Three times a week    Attends Religious Services: Patient declined    Active Member of Clubs or Organizations: Yes    Attends Tax inspector Meetings: 1 to 4 times per year    Marital Status: Married  Catering manager Violence: Not on file    Past Surgical History:  Procedure Laterality Date   VAGINAL DELIVERY     2   WISDOM TOOTH EXTRACTION      Family History  Problem Relation Age of Onset   Pancreatic cancer Mother 54   Hypertension Father    Colon cancer Maternal Aunt    Breast cancer Neg Hx     No Known Allergies  Current Outpatient Medications on File Prior to Visit  Medication Sig Dispense Refill   psyllium (REGULOID) 0.52 G capsule Take 0.52 g by mouth daily.     No current facility-administered medications on file prior to visit.    BP 118/64   Pulse 62   Temp (!) 97.3 F (36.3 C) (Temporal)   Ht 5\' 8"  (1.727 m)   Wt 147 lb (66.7 kg)   SpO2 97%   BMI 22.35 kg/m  Objective:   Physical Exam Exam conducted with a chaperone present.  HENT:     Right Ear: Tympanic membrane and ear canal normal.     Left Ear: Tympanic membrane and ear canal normal.  Eyes:     Pupils: Pupils are equal, round, and reactive to light.  Cardiovascular:     Rate and Rhythm: Normal rate and regular rhythm.  Pulmonary:     Effort: Pulmonary effort is normal.     Breath sounds: Normal breath sounds.  Abdominal:     General: Bowel sounds are normal.     Palpations: Abdomen is soft.     Tenderness: There is no abdominal tenderness.  Genitourinary:    Labia:        Right: No rash, tenderness or lesion.        Left: No rash, tenderness or lesion.      Vagina: Normal.     Cervix: Normal.     Uterus: Normal.      Adnexa: Right adnexa normal and left adnexa normal.  Musculoskeletal:        General: Normal range of motion.     Cervical back: Neck supple.  Skin:    General: Skin is warm and dry.  Neurological:     Mental Status: She is alert and oriented to person, place, and time.     Cranial Nerves: No cranial nerve deficit.     Deep Tendon Reflexes:     Reflex Scores:      Patellar reflexes are 2+  on the right side and 2+ on the left side. Psychiatric:        Mood and Affect: Mood normal.           Assessment & Plan:  Encounter for annual general medical examination with abnormal findings in adult Assessment & Plan: Tetanus due. Pap smear completed today. Mammogram completed today, results pending. Colon cancer screening up-to-date, due 2027  Discussed the importance of a healthy diet and regular exercise in order for weight loss, and to reduce the risk of further co-morbidity.  Exam stable. Labs declined today, she will return when fasting.  Follow up in 1 year for repeat physical.    Hyperlipidemia, unspecified hyperlipidemia type Assessment & Plan: Repeat lipid panel pending. Plans to return when fasting.  Orders: -     Lipid panel; Future -     Comprehensive metabolic panel with GFR; Future -     CBC; Future  Screening for cervical cancer -     Cytology - PAP  Vaginal atrophy -     Estradiol; Apply 0.5 g vaginally 2-3 times weekly  Dispense: 42.5 g; Refill: 2  Perimenopause Assessment & Plan: Irregularity of menses and symptoms suggestive. Pap smear completed today.  Prescription for Estrace cream provided to use 2-3 times weekly for vaginal dryness/atrophy. She will update if no improvement.          Ike Maragh K Lavaughn Haberle, NP

## 2024-04-23 LAB — CYTOLOGY - PAP
Comment: NEGATIVE
Diagnosis: NEGATIVE
High risk HPV: NEGATIVE

## 2024-05-24 ENCOUNTER — Ambulatory Visit
Admission: EM | Admit: 2024-05-24 | Discharge: 2024-05-24 | Disposition: A | Discharge status: Home / Self Care | Attending: Emergency Medicine | Admitting: Emergency Medicine

## 2024-05-24 DIAGNOSIS — B9689 Other specified bacterial agents as the cause of diseases classified elsewhere: Secondary | ICD-10-CM

## 2024-05-24 DIAGNOSIS — H60391 Other infective otitis externa, right ear: Secondary | ICD-10-CM

## 2024-05-24 DIAGNOSIS — H109 Unspecified conjunctivitis: Secondary | ICD-10-CM

## 2024-05-24 MED ORDER — MOXIFLOXACIN HCL 0.5 % OP SOLN: 1.0000 [drp] | Freq: Three times a day (TID) | OPHTHALMIC | 0 refills | Status: AC

## 2024-05-24 MED ORDER — OFLOXACIN 0.3 % OT SOLN: 10.0000 [drp] | Freq: Every day | OTIC | 0 refills | Status: AC

## 2024-05-24 NOTE — ED Provider Notes (Signed)
 Latasha Cooper    CSN: [id number] Arrival date & time: 05/24/24  1004      History   Chief Complaint Chief Complaint  Patient presents with   Eye Problem   Otalgia    HPI Latasha Cooper is a 58 y.o. female.   Patient presents for evaluation of drainage to the right ear beginning 10 days ago.  Initially thought to be clogged with wax and did not irrigate irrigation at home, after wax came out begin to see yellow drainage which has persisted.  Denies pain, decreased hearing.  Patient concerned with bilateral erythema, drainage with crusting, blurriness to the left side to the bilateral eyes beginning yesterday.  Denies injury or trauma, light sensitivity.  Past Medical History:  Diagnosis Date   Caregiver stress 09/07/2022   Chicken pox    Dizziness 09/07/2022   IUD 10/162012   Dr. 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 Authorizing Provider  moxifloxacin (VIGAMOX) 0.5 % ophthalmic solution Place 1 drop into both eyes 3 (three) times daily. 05/24/24  Yes Calee Nugent R, NP  ofloxacin (FLOXIN) 0.3 % OTIC solution Place 10 drops into the right ear daily. 05/24/24  Yes Reena Canning, NP  estradiol  (ESTRACE  VAGINAL) 0.1 MG/GM  vaginal cream Apply 0.5 g vaginally 2-3 times weekly 04/18/24   Clark, Katherine K, NP  psyllium (REGULOID) 0.52 G capsule Take 0.52 g by mouth daily.    [provider]    Family History Family History  Problem Relation Age of Onset   Pancreatic cancer Mother 5   Hypertension Father    Colon cancer Maternal Aunt    Breast cancer Neg Hx     Social History Social History   Tobacco Use   Smoking status: Never   Smokeless tobacco: Never  Vaping Use   Vaping status: Never Used  Substance Use Topics   Alcohol use: Yes   Drug use: No     Allergies   Patient has no known allergies.   Review of Systems Review of Systems   Physical Exam Triage Vital Signs ED Triage Vitals  Encounter Vitals Group     BP 05/24/24 1052 (!) 156/95     Systolic BP Percentile --      Diastolic BP Percentile --      Pulse Rate 05/24/24 1052 66     Resp 05/24/24 1052 16     Temp 05/24/24 1052 97.8  F (36.6 C)     Temp Source 05/24/24 1052 Temporal     SpO2 05/24/24 1052 96 %     Weight --      Height --      Head Circumference --      Peak Flow --      Pain Score 05/24/24 1104 0     Pain Loc --      Pain Education --      Exclude from Growth Chart --    No data found.  Updated Vital Signs BP (!) 156/95 (BP Location: Left Arm)   Pulse 66   Temp 97.8 F (36.6 C) (Temporal)   Resp 16   SpO2 96%   Visual Acuity Right Eye Distance: 20/50 Left Eye Distance: 20/60 Bilateral Distance: 20/40 (without corrective lenses)  Right Eye Near:   Left Eye Near:    Bilateral Near:     Physical Exam Constitutional:      Appearance: Normal appearance.  HENT:     Ears:     Comments: Copious yellow purulent drainage present to the right ear canal, unable to visualize the tympanic membrane Eyes:     Comments: Erythema present to the left conjunctiva, scant erythema present to the right conjunctiva, no drainage noted on exam, no stye present on exam, vision grossly intact  Pulmonary:      Effort: Pulmonary effort is normal.  Neurological:     Mental Status: She is alert and oriented to person, place, and time. Mental status is at baseline.      UC Treatments / Results  Labs (all labs ordered are listed, but only abnormal results are displayed) Labs Reviewed - No data to display  EKG   Radiology No results found.  Procedures Procedures (including critical care time)  Medications Ordered in UC Medications - No data to display  Initial Impression / Assessment and Plan / UC Course  I have reviewed the triage vital signs and the nursing notes.  Pertinent labs & imaging results that were available during my care of the patient were reviewed by me and considered in my medical decision making (see chart for details).  Bacterial conjunctivitis of both eyes, infective otitis externa of right ear  Presentation and symptomology of the eyes consistent with a conjunctivitis, discussed with patient, prescribed moxifloxacin and discussed administration recommended additional supportive care advised follow-up as needed  Copious drainage present on exam consistent with infection, prescribed ofloxacin and discussed administration recommended supportive care and advised follow-up if symptoms persist Final Clinical Impressions(s) / UC Diagnoses   Final diagnoses:  Bacterial conjunctivitis of both eyes  Infective otitis externa of right ear   Discharge Instructions      Today you being treated for bacterial conjunctivitis.   Place one drop of moxifloxacin into the effected eye every 8 hours while awake for 7 days. If the other eye starts to have symptoms you may use medication in it as well. Do not allow tip of dropper to touch eye.  May use cool compress for comfort and to remove discharge if present. Pat the eye, do not wipe.  Do not rub eyes, this may cause more irritation.  May use benadryl as needed to help if itching present.  Please avoid use of eye makeup  until symptoms clear.  If symptoms persist after use of medication, please follow up at Urgent Care or with ophthalmologist (eye doctor)   Today you are being treated for an infection of the ear canal  \  Place 10 drops of ofloxacin into the right ear every morning for 7 days  You may use Tylenol or ibuprofen for management of discomfort  May hold warm compresses to the ear for additional comfort  Please not attempted any ear cleaning or object or fluid placement into the ear canal to prevent further irritation   ED Prescriptions     Medication Sig Dispense Auth. Provider   ofloxacin (FLOXIN) 0.3 % OTIC solution Place 10 drops into the right ear daily. 5 mL Melvin Whiteford R, NP   moxifloxacin (VIGAMOX) 0.5 % ophthalmic solution Place 1 drop into both eyes 3 (three) times daily. 3 mL Reena Canning, NP      PDMP not reviewed this encounter.   Reena Canning, NP 05/24/24 1119

## 2024-05-24 NOTE — ED Triage Notes (Signed)
 Patient presents to UC for left eye problem drainage and blurriness since yesterday. Also concerned with right ear drainage since 05/28.

## 2024-05-24 NOTE — Discharge Instructions (Signed)
 Today you being treated for bacterial conjunctivitis.   Place one drop of moxifloxacin into the effected eye every 8 hours while awake for 7 days. If the other eye starts to have symptoms you may use medication in it as well. Do not allow tip of dropper to touch eye.  May use cool compress for comfort and to remove discharge if present. Pat the eye, do not wipe.  Do not rub eyes, this may cause more irritation.  May use benadryl as needed to help if itching present.  Please avoid use of eye makeup until symptoms clear.  If symptoms persist after use of medication, please follow up at Urgent Care or with ophthalmologist (eye doctor)   Today you are being treated for an infection of the ear canal  \Place 10 drops of ofloxacin into the right ear every morning for 7 days  You may use Tylenol or ibuprofen for management of discomfort  May hold warm compresses to the ear for additional comfort  Please not attempted any ear cleaning or object or fluid placement into the ear canal to prevent further irritation

## 2024-10-15 ENCOUNTER — Other Ambulatory Visit: Payer: Self-pay

## 2024-10-15 MED ORDER — FLUZONE 0.5 ML IM SUSY
0.5000 mL | PREFILLED_SYRINGE | Freq: Once | INTRAMUSCULAR | 0 refills | Status: AC
  Filled 2024-10-15: qty 0.5, 1d supply, fill #0
